# Patient Record
Sex: Male | Born: 1955 | Race: Black or African American | Hispanic: No | Marital: Married | Smoking: Never smoker
Health system: Southern US, Community
[De-identification: ages and names within clinical notes are randomized; demographics above are authoritative.]

## PROBLEM LIST (undated history)

## (undated) DIAGNOSIS — M109 Gout, unspecified: Secondary | ICD-10-CM

## (undated) DIAGNOSIS — I2510 Atherosclerotic heart disease of native coronary artery without angina pectoris: Secondary | ICD-10-CM

## (undated) DIAGNOSIS — I639 Cerebral infarction, unspecified: Secondary | ICD-10-CM

## (undated) DIAGNOSIS — Z5189 Encounter for other specified aftercare: Secondary | ICD-10-CM

## (undated) DIAGNOSIS — E7800 Pure hypercholesterolemia, unspecified: Secondary | ICD-10-CM

## (undated) DIAGNOSIS — I10 Essential (primary) hypertension: Secondary | ICD-10-CM

## (undated) DIAGNOSIS — R569 Unspecified convulsions: Secondary | ICD-10-CM

## (undated) HISTORY — PX: TONSILLECTOMY: SUR1361

## (undated) HISTORY — PX: CORONARY ANGIOPLASTY WITH STENT PLACEMENT: SHX49

---

## 2011-06-22 ENCOUNTER — Encounter: Payer: Self-pay | Admitting: *Deleted

## 2011-06-22 ENCOUNTER — Emergency Department (HOSPITAL_BASED_OUTPATIENT_CLINIC_OR_DEPARTMENT_OTHER)
Admission: EM | Admit: 2011-06-22 | Discharge: 2011-06-22 | Disposition: A | Discharge status: Home / Self Care | Payer: Medicare Other | Attending: Emergency Medicine | Admitting: Emergency Medicine

## 2011-06-22 DIAGNOSIS — I2510 Atherosclerotic heart disease of native coronary artery without angina pectoris: Secondary | ICD-10-CM | POA: Insufficient documentation

## 2011-06-22 DIAGNOSIS — Z79899 Other long term (current) drug therapy: Secondary | ICD-10-CM | POA: Insufficient documentation

## 2011-06-22 DIAGNOSIS — Z8679 Personal history of other diseases of the circulatory system: Secondary | ICD-10-CM | POA: Insufficient documentation

## 2011-06-22 DIAGNOSIS — R569 Unspecified convulsions: Secondary | ICD-10-CM | POA: Insufficient documentation

## 2011-06-22 HISTORY — DX: Cerebral infarction, unspecified: I63.9

## 2011-06-22 HISTORY — DX: Unspecified convulsions: R56.9

## 2011-06-22 HISTORY — DX: Encounter for other specified aftercare: Z51.89

## 2011-06-22 HISTORY — DX: Atherosclerotic heart disease of native coronary artery without angina pectoris: I25.10

## 2011-06-22 LAB — DIFFERENTIAL
Eosinophils Absolute: 0.3 10*3/uL (ref 0.0–0.7)
Eosinophils Relative: 2 % (ref 0–5)
Lymphs Abs: 4.8 10*3/uL — ABNORMAL HIGH (ref 0.7–4.0)
Monocytes Absolute: 0.7 10*3/uL (ref 0.1–1.0)

## 2011-06-22 LAB — BASIC METABOLIC PANEL
CO2: 25 mEq/L (ref 19–32)
Calcium: 9.5 mg/dL (ref 8.4–10.5)
Chloride: 103 mEq/L (ref 96–112)
Creatinine, Ser: 1.2 mg/dL (ref 0.50–1.35)
Glucose, Bld: 130 mg/dL — ABNORMAL HIGH (ref 70–99)
Sodium: 139 mEq/L (ref 135–145)

## 2011-06-22 LAB — URINALYSIS, ROUTINE W REFLEX MICROSCOPIC
Glucose, UA: NEGATIVE mg/dL
Leukocytes, UA: NEGATIVE
Nitrite: NEGATIVE
Specific Gravity, Urine: 1.026 (ref 1.005–1.030)
pH: 5 (ref 5.0–8.0)

## 2011-06-22 LAB — CBC
HCT: 39.3 % (ref 39.0–52.0)
MCH: 29.7 pg (ref 26.0–34.0)
MCV: 85.8 fL (ref 78.0–100.0)
Platelets: 289 10*3/uL (ref 150–400)
RBC: 4.58 MIL/uL (ref 4.22–5.81)

## 2011-06-22 LAB — CARDIAC PANEL(CRET KIN+CKTOT+MB+TROPI)
Relative Index: 0.8 (ref 0.0–2.5)
Total CK: 437 U/L — ABNORMAL HIGH (ref 7–232)

## 2011-06-22 MED ORDER — PHENYTOIN SODIUM EXTENDED 100 MG PO CAPS
300.0000 mg | ORAL_CAPSULE | Freq: Once | ORAL | Status: AC
  Administered 2011-06-22: 300 mg via ORAL
  Filled 2011-06-22: qty 3

## 2011-06-22 MED ORDER — PHENYTOIN SODIUM EXTENDED 100 MG PO CAPS: 300.0000 mg | ORAL_CAPSULE | Freq: Every day | ORAL | Status: DC

## 2011-06-22 MED ORDER — POTASSIUM CHLORIDE CRYS ER 20 MEQ PO TBCR
40.0000 meq | EXTENDED_RELEASE_TABLET | Freq: Once | ORAL | Status: AC
  Administered 2011-06-22: 40 meq via ORAL
  Filled 2011-06-22: qty 2

## 2011-06-22 NOTE — ED Notes (Signed)
Pt was visiting family when he states that he became hot, flushed and dizzy states that he closed his eyes and woke with medical staff attending him. Pt with hx of seizures and CVA states that he has not had a seizure in 4 years and was taken off of his seizure medication. Pt is alert and oriented. Answers questions appropriately although slowly. Pupils equal and reactive

## 2011-06-22 NOTE — ED Notes (Signed)
I placed a call to Dr. Chase Picket office for the MD on call per Dr. Ashby Dawes. The call was returned @ 0552am

## 2011-06-22 NOTE — ED Provider Notes (Signed)
History     CSN: [id number] Arrival date & time: 06/22/2011  2:00 AM   First MD Initiated Contact with Patient 06/22/11 0532      Chief Complaint  Patient presents with  . Altered Mental Status    (Consider location/radiation/quality/duration/timing/severity/associated sxs/prior treatment) HPI While waiting with his wife who was a patient in the emergency department today patient developed sudden loss of consciousness, not witnessed by staff. Wife reports that he had generalized shaking and possible seizure activity lasting 3 or 4 minutes followed by confusion. Symptoms resolved after several minutes without treatment. Patient presently asymptomatic last seizure was 4 years ago. No treatment prior to coming here. Patient taken off of Dilantin by his primary care Dr. 2 years ago. Denies pain anywhere. Symptoms were moderate to severe lasting 3 or 4 minutes followed by several minutes of confusion Past Medical History  Diagnosis Date  . Stroke   . Coronary artery disease   . Seizures   . Blood transfusion     Past Surgical History  Procedure Date  . Coronary angioplasty with stent placement     No family history on file.  History  Substance Use Topics  . Smoking status: Never Smoker   . Smokeless tobacco: Not on file  . Alcohol Use: Yes     occasional      Review of Systems  Constitutional: Negative.   HENT: Negative.   Respiratory: Negative.   Cardiovascular: Negative.   Gastrointestinal: Negative.   Musculoskeletal: Negative.   Skin: Negative.   Neurological: Positive for seizures.  Hematological: Negative.   Psychiatric/Behavioral: Negative.   All other systems reviewed and are negative.    Allergies  Review of patient's allergies indicates no known allergies.  Home Medications   Current Outpatient Rx  Name Route Sig Dispense Refill  . CLONIDINE HCL PO Oral Take by mouth.      Marland Kitchen PLAVIX PO Oral Take by mouth.      Marland Kitchen HYDROCHLOROTHIAZIDE PO Oral Take by  mouth.      Marland Kitchen HYDROCODONE-ACETAMINOPHEN PO Oral Take by mouth.      Marland Kitchen LISINOPRIL PO Oral Take by mouth.      . SERTRALINE HCL PO Oral Take by mouth.      Marland Kitchen SIMVASTATIN PO Oral Take by mouth.        BP 124/84  Pulse 64  Temp(Src) 97.7 F (36.5 C) (Oral)  Resp 13  Ht 6\' 3"  (1.905 m)  Wt 239 lb (108.41 kg)  BMI 29.87 kg/m2  SpO2 98%  Physical Exam  Constitutional: He appears well-developed and well-nourished.  HENT:  Head: Normocephalic and atraumatic.  Eyes: Conjunctivae are normal. Pupils are equal, round, and reactive to light.  Neck: Neck supple. No tracheal deviation present. No thyromegaly present.  Cardiovascular: Normal rate and regular rhythm.   No murmur heard. Pulmonary/Chest: Effort normal and breath sounds normal.  Abdominal: Soft. Bowel sounds are normal. He exhibits no distension. There is no tenderness.  Musculoskeletal: Normal range of motion. He exhibits no edema and no tenderness.  Neurological: He is alert. No cranial nerve deficit. He exhibits normal muscle tone. Coordination normal.       Gait normal, no lightheadedness on standing  Skin: Skin is warm and dry. No rash noted.  Psychiatric: He has a normal mood and affect.    Date: 06/22/2011  Rate: 70  Rhythm: normal sinus rhythm  QRS Axis: normal  Intervals: normal  ST/T Wave abnormalities: nonspecific ST/T changes  Conduction Disutrbances:none  Narrative Interpretation:   Old EKG Reviewed: none available  ED Course  Procedures (including critical care time)  Labs Reviewed  BASIC METABOLIC PANEL - Abnormal; Notable for the following:    Potassium 3.4 (*)    Glucose, Bld 130 (*)    GFR calc non Af Amer 66 (*)    GFR calc Af Amer 76 (*)    All other components within normal limits  CBC - Abnormal; Notable for the following:    WBC 12.0 (*)    All other components within normal limits  DIFFERENTIAL - Abnormal; Notable for the following:    Lymphs Abs 4.8 (*)    All other components within  normal limits  CARDIAC PANEL(CRET KIN+CKTOT+MB+TROPI) - Abnormal; Notable for the following:    Total CK 437 (*)    All other components within normal limits  URINALYSIS, ROUTINE W REFLEX MICROSCOPIC   No results found.   No diagnosis found. Results for orders placed during the hospital encounter of 06/22/11  BASIC METABOLIC PANEL      Component Value Range   Sodium 139  135 - 145 (mEq/L)   Potassium 3.4 (*) 3.5 - 5.1 (mEq/L)   Chloride 103  96 - 112 (mEq/L)   CO2 25  19 - 32 (mEq/L)   Glucose, Bld 130 (*) 70 - 99 (mg/dL)   BUN 16  6 - 23 (mg/dL)   Creatinine, Ser 8.46  0.50 - 1.35 (mg/dL)   Calcium 9.5  8.4 - 96.2 (mg/dL)   GFR calc non Af Amer 66 (*) >90 (mL/min)   GFR calc Af Amer 76 (*) >90 (mL/min)  CBC      Component Value Range   WBC 12.0 (*) 4.0 - 10.5 (K/uL)   RBC 4.58  4.22 - 5.81 (MIL/uL)   Hemoglobin 13.6  13.0 - 17.0 (g/dL)   HCT 95.2  84.1 - 32.4 (%)   MCV 85.8  78.0 - 100.0 (fL)   MCH 29.7  26.0 - 34.0 (pg)   MCHC 34.6  30.0 - 36.0 (g/dL)   RDW 40.1  02.7 - 25.3 (%)   Platelets 289  150 - 400 (K/uL)  DIFFERENTIAL      Component Value Range   Neutrophils Relative 51  43 - 77 (%)   Neutro Abs 6.2  1.7 - 7.7 (K/uL)   Lymphocytes Relative 40  12 - 46 (%)   Lymphs Abs 4.8 (*) 0.7 - 4.0 (K/uL)   Monocytes Relative 6  3 - 12 (%)   Monocytes Absolute 0.7  0.1 - 1.0 (K/uL)   Eosinophils Relative 2  0 - 5 (%)   Eosinophils Absolute 0.3  0.0 - 0.7 (K/uL)   Basophils Relative 0  0 - 1 (%)   Basophils Absolute 0.1  0.0 - 0.1 (K/uL)  CARDIAC PANEL(CRET KIN+CKTOT+MB+TROPI)      Component Value Range   Total CK 437 (*) 7 - 232 (U/L)   CK, MB 3.5  0.3 - 4.0 (ng/mL)   Troponin I <0.30  <0.30 (ng/mL)   Relative Index 0.8  0.0 - 2.5   URINALYSIS, ROUTINE W REFLEX MICROSCOPIC      Component Value Range   Color, Urine YELLOW  YELLOW    APPearance CLEAR  CLEAR    Specific Gravity, Urine 1.026  1.005 - 1.030    pH 5.0  5.0 - 8.0    Glucose, UA NEGATIVE  NEGATIVE  (mg/dL)   Hgb urine dipstick NEGATIVE  NEGATIVE    Bilirubin  Urine NEGATIVE  NEGATIVE    Ketones, ur NEGATIVE  NEGATIVE (mg/dL)   Protein, ur NEGATIVE  NEGATIVE (mg/dL)   Urobilinogen, UA 0.2  0.0 - 1.0 (mg/dL)   Nitrite NEGATIVE  NEGATIVE    Leukocytes, UA NEGATIVE  NEGATIVE    No results found.    MDM  In light of patient's history of seizure disorder I feel that patient most likely suffered seizure tonight given the history Plan prescription Dilantin,. Spoke with Dr. Wallace Cullens. Patient coughs today for followup and to arrange for neurology referral Diagnosis #1 seizure #2 hypokalemia        Doug Sou, MD 06/22/11 [phone]

## 2011-06-22 NOTE — ED Notes (Signed)
Pt is visitor with his wife who is pt in ED- wife reports she felt pt "kick the bed" and she called for help- pt was found sitting up in chair, eyes open, initially unresponsive to sternal rub- blowing respirations- ED staff and EDP Jacubowitz in to assess assist- pt spontaneously came out of event- initially stated that he was "sleeping"- pt then reported that he felt dizzy and hot so he closed his eyes- states still feeling dizzy at present

## 2018-03-20 ENCOUNTER — Encounter (HOSPITAL_BASED_OUTPATIENT_CLINIC_OR_DEPARTMENT_OTHER): Payer: Self-pay | Admitting: Emergency Medicine

## 2018-03-20 ENCOUNTER — Other Ambulatory Visit: Payer: Self-pay

## 2018-03-20 ENCOUNTER — Emergency Department (HOSPITAL_BASED_OUTPATIENT_CLINIC_OR_DEPARTMENT_OTHER)
Admission: EM | Admit: 2018-03-20 | Discharge: 2018-03-20 | Disposition: A | Discharge status: Home / Self Care | Payer: Medicare PPO | Attending: Emergency Medicine | Admitting: Emergency Medicine

## 2018-03-20 DIAGNOSIS — Z7902 Long term (current) use of antithrombotics/antiplatelets: Secondary | ICD-10-CM | POA: Diagnosis not present

## 2018-03-20 DIAGNOSIS — Z79899 Other long term (current) drug therapy: Secondary | ICD-10-CM | POA: Insufficient documentation

## 2018-03-20 DIAGNOSIS — I2510 Atherosclerotic heart disease of native coronary artery without angina pectoris: Secondary | ICD-10-CM | POA: Diagnosis not present

## 2018-03-20 DIAGNOSIS — M109 Gout, unspecified: Secondary | ICD-10-CM | POA: Diagnosis not present

## 2018-03-20 DIAGNOSIS — Z8673 Personal history of transient ischemic attack (TIA), and cerebral infarction without residual deficits: Secondary | ICD-10-CM | POA: Diagnosis not present

## 2018-03-20 DIAGNOSIS — M25579 Pain in unspecified ankle and joints of unspecified foot: Secondary | ICD-10-CM | POA: Diagnosis present

## 2018-03-20 DIAGNOSIS — I10 Essential (primary) hypertension: Secondary | ICD-10-CM | POA: Insufficient documentation

## 2018-03-20 HISTORY — DX: Gout, unspecified: M10.9

## 2018-03-20 HISTORY — DX: Pure hypercholesterolemia, unspecified: E78.00

## 2018-03-20 HISTORY — DX: Essential (primary) hypertension: I10

## 2018-03-20 MED ORDER — COLCHICINE 0.6 MG PO TABS: 0.6000 mg | ORAL_TABLET | Freq: Every day | ORAL | 0 refills | Status: DC

## 2018-03-20 MED ORDER — OXYCODONE-ACETAMINOPHEN 5-325 MG PO TABS: 1.0000 | ORAL_TABLET | Freq: Four times a day (QID) | ORAL | 0 refills | Status: DC | PRN

## 2018-03-20 MED ORDER — COLCHICINE 0.6 MG PO TABS
1.2000 mg | ORAL_TABLET | Freq: Once | ORAL | Status: AC
  Administered 2018-03-20: 1.2 mg via ORAL
  Filled 2018-03-20: qty 2

## 2018-03-20 MED ORDER — PREDNISONE 20 MG PO TABS: 40.0000 mg | ORAL_TABLET | Freq: Every day | ORAL | 0 refills | Status: DC

## 2018-03-20 MED ORDER — PREDNISONE 20 MG PO TABS
40.0000 mg | ORAL_TABLET | Freq: Once | ORAL | Status: AC
  Administered 2018-03-20: 40 mg via ORAL
  Filled 2018-03-20: qty 2

## 2018-03-20 MED ORDER — OXYCODONE-ACETAMINOPHEN 5-325 MG PO TABS
1.0000 | ORAL_TABLET | Freq: Once | ORAL | Status: AC
  Administered 2018-03-20: 1 via ORAL
  Filled 2018-03-20: qty 1

## 2018-03-20 NOTE — ED Triage Notes (Signed)
Pt c/o of bilat feet pain x 1 week. History of gout.

## 2018-03-28 NOTE — ED Provider Notes (Signed)
[facility] EMERGENCY DEPARTMENT Provider Note   CSN: [id number] Arrival date & time: 03/20/18  1329     History   Chief Complaint Chief Complaint  Patient presents with  . Foot Pain    HPI Cameron Woodward is a 62 y.o. male.  HPI   62 year old male with bilateral foot pain.  He has a past history of gout.  Feels like current symptoms are similar to that.  Pain is in the distal foot near the first T joints bilaterally.  Pain at rest much worse to touch or walking/movement.  No fevers or chills.  Past Medical History:  Diagnosis Date  . Blood transfusion   . Coronary artery disease   . Gout   . High cholesterol   . Hypertension   . Seizures (HCC)   . Stroke [facility])     There are no active problems to display for this patient.   Past Surgical History:  Procedure Laterality Date  . CORONARY ANGIOPLASTY WITH STENT PLACEMENT    . TONSILLECTOMY          Home Medications    Prior to Admission medications   Medication Sig Start Date End Date Taking? Authorizing Provider  amLODipine (NORVASC) 10 MG tablet Take 10 mg by mouth daily.   Yes [provider]  CLONIDINE HCL PO Take by mouth.      [provider]  Clopidogrel Bisulfate (PLAVIX PO) Take by mouth.      [provider]  colchicine 0.6 MG tablet Take 1 tablet (0.6 mg total) by mouth daily. 03/20/18   Raeford Razor, MD  HYDROCHLOROTHIAZIDE PO Take by mouth.      [provider]  HYDROCODONE-ACETAMINOPHEN PO Take by mouth.      [provider]  LISINOPRIL PO Take by mouth.      [provider]  oxyCODONE-acetaminophen (PERCOCET/ROXICET) 5-325 MG tablet Take 1 tablet by mouth every 6 (six) hours as needed for severe pain. 03/20/18   Raeford Razor, MD  phenytoin (DILANTIN) 100 MG ER capsule Take 3 capsules (300 mg total) by mouth daily. 06/22/11 06/21/12  Doug Sou, MD  predniSONE (DELTASONE) 20 MG tablet Take 2 tablets (40 mg total) by mouth daily.  03/20/18   Raeford Razor, MD  SERTRALINE HCL PO Take by mouth.      [provider]  SIMVASTATIN PO Take by mouth.      [provider]    Family History No family history on file.  Social History Social History   Tobacco Use  . Smoking status: Never Smoker  . Smokeless tobacco: Never Used  Substance Use Topics  . Alcohol use: Yes    Comment: occasional  . Drug use: No     Allergies   Patient has no known allergies.   Review of Systems Review of Systems  All systems reviewed and negative, other than as noted in HPI.  Physical Exam Updated Vital Signs BP (!) 113/97 (BP Location: Right Arm)   Pulse (!) 106   Temp 98.9 F (37.2 C) (Oral)   Resp 20   Ht 6\' 2"  (1.88 m)   Wt 106.6 kg   SpO2 98%   BMI 30.17 kg/m   Physical Exam  Constitutional: He appears well-developed and well-nourished. No distress.  HENT:  Head: Normocephalic and atraumatic.  Eyes: Conjunctivae are normal. Right eye exhibits no discharge. Left eye exhibits no discharge.  Neck: Neck supple.  Cardiovascular: Normal rate, regular rhythm and normal heart sounds. Exam  reveals no gallop and no friction rub.  No murmur heard. Pulmonary/Chest: Effort normal and breath sounds normal. No respiratory distress.  Abdominal: Soft. He exhibits no distension. There is no tenderness.  Musculoskeletal: He exhibits tenderness. He exhibits no edema.  Faint erythema and tenderness with palpation and movement of the first MTP joint bilaterally.  Minimal swelling.  Neurological: He is alert.  Skin: Skin is warm and dry.  Psychiatric: He has a normal mood and affect. His behavior is normal. Thought content normal.  Nursing note and vitals reviewed.    ED Treatments / Results  Labs (all labs ordered are listed, but only abnormal results are displayed) Labs Reviewed - No data to display  EKG None  Radiology No results found.  Procedures Procedures (including critical care  time)  Medications Ordered in ED Medications  oxyCODONE-acetaminophen (PERCOCET/ROXICET) 5-325 MG per tablet 1 tablet (1 tablet Oral Given 03/20/18 1404)  colchicine tablet 1.2 mg (1.2 mg Oral Given 03/20/18 1404)  predniSONE (DELTASONE) tablet 40 mg (40 mg Oral Given 03/20/18 1404)     Initial Impression / Assessment and Plan / ED Course  I have reviewed the triage vital signs and the nursing notes.  Pertinent labs & imaging results that were available during my care of the patient were reviewed by me and considered in my medical decision making (see chart for details).     Symptoms consistent with gout.  Doubt infectious.  Plan symptom medic treatment.  Return precautions discussed.  Final Clinical Impressions(s) / ED Diagnoses   Final diagnoses:  Gout of multiple sites, unspecified cause, unspecified chronicity    ED Discharge Orders         Ordered    colchicine 0.6 MG tablet  Daily     03/20/18 1400    predniSONE (DELTASONE) 20 MG tablet  Daily,   Status:  Discontinued     03/20/18 1400    oxyCODONE-acetaminophen (PERCOCET/ROXICET) 5-325 MG tablet  Every 6 hours PRN     03/20/18 1400    predniSONE (DELTASONE) 20 MG tablet  Daily     03/20/18 1401           Raeford Razor, MD 03/28/18 [phone]

## 2020-03-06 ENCOUNTER — Emergency Department (HOSPITAL_COMMUNITY): Payer: Medicare HMO

## 2020-03-06 ENCOUNTER — Inpatient Hospital Stay (HOSPITAL_COMMUNITY)
Admission: EM | Admit: 2020-03-06 | Discharge: 2020-03-10 | DRG: 177 | Disposition: A | Discharge status: Home / Self Care | Payer: Medicare HMO | Attending: Internal Medicine | Admitting: Internal Medicine

## 2020-03-06 ENCOUNTER — Encounter (HOSPITAL_COMMUNITY): Payer: Self-pay

## 2020-03-06 DIAGNOSIS — I2510 Atherosclerotic heart disease of native coronary artery without angina pectoris: Secondary | ICD-10-CM | POA: Diagnosis present

## 2020-03-06 DIAGNOSIS — G40909 Epilepsy, unspecified, not intractable, without status epilepticus: Secondary | ICD-10-CM | POA: Diagnosis present

## 2020-03-06 DIAGNOSIS — E861 Hypovolemia: Secondary | ICD-10-CM | POA: Diagnosis present

## 2020-03-06 DIAGNOSIS — Z6830 Body mass index (BMI) 30.0-30.9, adult: Secondary | ICD-10-CM

## 2020-03-06 DIAGNOSIS — E860 Dehydration: Secondary | ICD-10-CM | POA: Diagnosis present

## 2020-03-06 DIAGNOSIS — D649 Anemia, unspecified: Secondary | ICD-10-CM | POA: Diagnosis present

## 2020-03-06 DIAGNOSIS — E669 Obesity, unspecified: Secondary | ICD-10-CM | POA: Diagnosis present

## 2020-03-06 DIAGNOSIS — N179 Acute kidney failure, unspecified: Secondary | ICD-10-CM | POA: Diagnosis present

## 2020-03-06 DIAGNOSIS — Z7952 Long term (current) use of systemic steroids: Secondary | ICD-10-CM | POA: Diagnosis not present

## 2020-03-06 DIAGNOSIS — Z79899 Other long term (current) drug therapy: Secondary | ICD-10-CM

## 2020-03-06 DIAGNOSIS — Z955 Presence of coronary angioplasty implant and graft: Secondary | ICD-10-CM

## 2020-03-06 DIAGNOSIS — U071 COVID-19: Secondary | ICD-10-CM | POA: Diagnosis present

## 2020-03-06 DIAGNOSIS — I10 Essential (primary) hypertension: Secondary | ICD-10-CM | POA: Diagnosis present

## 2020-03-06 DIAGNOSIS — Z8673 Personal history of transient ischemic attack (TIA), and cerebral infarction without residual deficits: Secondary | ICD-10-CM | POA: Diagnosis not present

## 2020-03-06 DIAGNOSIS — E785 Hyperlipidemia, unspecified: Secondary | ICD-10-CM | POA: Diagnosis present

## 2020-03-06 DIAGNOSIS — R7989 Other specified abnormal findings of blood chemistry: Secondary | ICD-10-CM

## 2020-03-06 DIAGNOSIS — E7800 Pure hypercholesterolemia, unspecified: Secondary | ICD-10-CM | POA: Diagnosis present

## 2020-03-06 DIAGNOSIS — Z7902 Long term (current) use of antithrombotics/antiplatelets: Secondary | ICD-10-CM | POA: Diagnosis not present

## 2020-03-06 DIAGNOSIS — J1282 Pneumonia due to coronavirus disease 2019: Secondary | ICD-10-CM | POA: Diagnosis present

## 2020-03-06 DIAGNOSIS — I69354 Hemiplegia and hemiparesis following cerebral infarction affecting left non-dominant side: Secondary | ICD-10-CM

## 2020-03-06 DIAGNOSIS — M109 Gout, unspecified: Secondary | ICD-10-CM | POA: Diagnosis present

## 2020-03-06 LAB — COMPREHENSIVE METABOLIC PANEL
ALT: 35 U/L (ref 0–44)
AST: 39 U/L (ref 15–41)
Albumin: 4.3 g/dL (ref 3.5–5.0)
Alkaline Phosphatase: 84 U/L (ref 38–126)
Anion gap: 15 (ref 5–15)
BUN: 54 mg/dL — ABNORMAL HIGH (ref 8–23)
CO2: 20 mmol/L — ABNORMAL LOW (ref 22–32)
Calcium: 9 mg/dL (ref 8.9–10.3)
Chloride: 104 mmol/L (ref 98–111)
Creatinine, Ser: 3.15 mg/dL — ABNORMAL HIGH (ref 0.61–1.24)
GFR calc Af Amer: 23 mL/min — ABNORMAL LOW (ref >60)
GFR calc non Af Amer: 20 mL/min — ABNORMAL LOW (ref >60)
Glucose, Bld: 114 mg/dL — ABNORMAL HIGH (ref 70–99)
Potassium: 3.9 mmol/L (ref 3.5–5.1)
Sodium: 139 mmol/L (ref 135–145)
Total Bilirubin: 0.6 mg/dL (ref 0.3–1.2)
Total Protein: 8.3 g/dL — ABNORMAL HIGH (ref 6.5–8.1)

## 2020-03-06 LAB — CBC WITH DIFFERENTIAL/PLATELET
Abs Immature Granulocytes: 0.06 10*3/uL (ref 0.00–0.07)
Basophils Absolute: 0 10*3/uL (ref 0.0–0.1)
Basophils Relative: 0 %
Eosinophils Absolute: 0 10*3/uL (ref 0.0–0.5)
Eosinophils Relative: 0 %
HCT: 40.3 % (ref 39.0–52.0)
Hemoglobin: 13.4 g/dL (ref 13.0–17.0)
Immature Granulocytes: 1 %
Lymphocytes Relative: 13 %
Lymphs Abs: 1.3 10*3/uL (ref 0.7–4.0)
MCH: 30.2 pg (ref 26.0–34.0)
MCHC: 33.3 g/dL (ref 30.0–36.0)
MCV: 90.8 fL (ref 80.0–100.0)
Monocytes Absolute: 0.5 10*3/uL (ref 0.1–1.0)
Monocytes Relative: 5 %
Neutro Abs: 8.3 10*3/uL — ABNORMAL HIGH (ref 1.7–7.7)
Neutrophils Relative %: 81 %
Platelets: 233 10*3/uL (ref 150–400)
RBC: 4.44 MIL/uL (ref 4.22–5.81)
RDW: 13.2 % (ref 11.5–15.5)
WBC: 10.2 10*3/uL (ref 4.0–10.5)
nRBC: 0 % (ref 0.0–0.2)

## 2020-03-06 LAB — FIBRINOGEN: Fibrinogen: 674 mg/dL — ABNORMAL HIGH (ref 210–475)

## 2020-03-06 LAB — PROCALCITONIN: Procalcitonin: 0.81 ng/mL

## 2020-03-06 LAB — LACTATE DEHYDROGENASE: LDH: 254 U/L — ABNORMAL HIGH (ref 98–192)

## 2020-03-06 LAB — SODIUM, URINE, RANDOM: Sodium, Ur: 27 mmol/L

## 2020-03-06 LAB — URINALYSIS, ROUTINE W REFLEX MICROSCOPIC
Bilirubin Urine: NEGATIVE
Glucose, UA: NEGATIVE mg/dL
Ketones, ur: NEGATIVE mg/dL
Leukocytes,Ua: NEGATIVE
Nitrite: NEGATIVE
Protein, ur: 30 mg/dL — AB
Specific Gravity, Urine: 1.019 (ref 1.005–1.030)
pH: 5 (ref 5.0–8.0)

## 2020-03-06 LAB — PHENYTOIN LEVEL, TOTAL: Phenytoin Lvl: <2.5 ug/mL — ABNORMAL LOW (ref 10.0–20.0)

## 2020-03-06 LAB — C-REACTIVE PROTEIN: CRP: 15.6 mg/dL — ABNORMAL HIGH (ref <1.0)

## 2020-03-06 LAB — TRIGLYCERIDES: Triglycerides: 220 mg/dL — ABNORMAL HIGH (ref <150)

## 2020-03-06 LAB — LACTIC ACID, PLASMA
Lactic Acid, Venous: 1.8 mmol/L (ref 0.5–1.9)
Lactic Acid, Venous: 1.7 mmol/L (ref 0.5–1.9)

## 2020-03-06 LAB — CREATININE, URINE, RANDOM: Creatinine, Urine: 485.32 mg/dL

## 2020-03-06 LAB — SARS CORONAVIRUS 2 BY RT PCR (HOSPITAL ORDER, PERFORMED IN ~~LOC~~ HOSPITAL LAB): SARS Coronavirus 2: POSITIVE — AB

## 2020-03-06 LAB — TROPONIN I (HIGH SENSITIVITY): Troponin I (High Sensitivity): 15 ng/L (ref <18)

## 2020-03-06 LAB — FERRITIN: Ferritin: 681 ng/mL — ABNORMAL HIGH (ref 24–336)

## 2020-03-06 LAB — D-DIMER, QUANTITATIVE: D-Dimer, Quant: 2.4 ug/mL-FEU — ABNORMAL HIGH (ref 0.00–0.50)

## 2020-03-06 MED ORDER — SODIUM CHLORIDE 0.9 % IV SOLN
200.0000 mg | Freq: Once | INTRAVENOUS | Status: AC
  Administered 2020-03-06: 200 mg via INTRAVENOUS
  Filled 2020-03-06: qty 200

## 2020-03-06 MED ORDER — SODIUM CHLORIDE 0.9 % IV SOLN
100.0000 mg | Freq: Every day | INTRAVENOUS | Status: AC
  Administered 2020-03-07 – 2020-03-10 (×4): 100 mg via INTRAVENOUS
  Filled 2020-03-06 (×5): qty 20

## 2020-03-06 MED ORDER — SODIUM CHLORIDE 0.9 % IV SOLN: Freq: Once | INTRAVENOUS | Status: AC

## 2020-03-06 MED ORDER — ACETAMINOPHEN 325 MG PO TABS
650.0000 mg | ORAL_TABLET | Freq: Once | ORAL | Status: AC
  Administered 2020-03-06: 650 mg via ORAL
  Filled 2020-03-06: qty 2

## 2020-03-06 MED ORDER — SODIUM CHLORIDE 0.9 % IV BOLUS
500.0000 mL | Freq: Once | INTRAVENOUS | Status: AC
  Administered 2020-03-06: 500 mL via INTRAVENOUS

## 2020-03-06 NOTE — H&P (Signed)
Cameron Woodward HER:[id number] DOB: 02/08/1956 DOA: 03/06/2020     PCP: Patient, No Pcp Per   Outpatient Specialists: none   Patient arrived to ER on 03/06/20 at 1313 Referred by Attending Alvira Monday, MD   Patient coming from: home Lives   With family    Chief Complaint:   Chief Complaint  Patient presents with  . COVID Like Symptoms    HPI: Cameron Woodward is a 64 y.o. male with medical history significant of CVA with right -sided weakness, CAD, HTN, gout history of seizures    Presented with fever weakness cough body aches fatigue no shortness of breath symptoms started on Monday after patient traveled to [place].  Reports decreased appetite chills he did not check his temperature.  No chest pain one episode of diarrhea no melena no bright red blood per rectum. On EMS arrival heart rate up to 130s satting 97% room air   Infectious risk factors:  Reports  Fever  dry cough,  URI symptoms, Diarrhea ,  Body aches, severe fatigue     Has NOt been vaccinated against COVID    Initial COVID TEST    POSITIVE,   Lab Results  Component Value Date   SARSCOV2NAA POSITIVE (A) 03/06/2020    Regarding pertinent Chronic problems:     Hyperlipidemia - on statins simvastatin   Lipid Panel     Component Value Date/Time   TRIG 220 (H) 03/06/2020 1754     HTN on Norvasc, clonidine hydrochlorothiazide lisinopril      CAD  - On Aspirin, statin,   Plavix                   Hx of CVA -  With  residual deficits on Aspirin 81 mg, 325, Plavix   Hx of Seizure on Dilantin, last seizure 2012  While in ER: Noted to be in AKI with creatinine up to 3.15 from baseline of 1.5. Covid positive Inflammatory markers elevated CRP 15.6   Hospitalist was called for admission for Covid infection  The following Work up has been ordered so far:  Orders Placed This Encounter  Procedures  . SARS Coronavirus 2 by RT PCR (hospital order, performed in [facility] hospital lab)  Nasopharyngeal Nasopharyngeal Swab  . Blood Culture (routine x 2)  . DG Chest 2 View  . CT Chest Wo Contrast  . Lactic acid, plasma  . CBC WITH DIFFERENTIAL  . Comprehensive metabolic panel  . D-dimer, quantitative  . Procalcitonin  . Lactate dehydrogenase  . Ferritin  . Triglycerides  . Fibrinogen  . C-reactive protein  . Urinalysis, Routine w reflex microscopic  . Diet NPO time specified  . Cardiac monitoring  . Insert peripheral IV x 2  . Initiate Carrier Fluid Protocol  . Place surgical mask on patient  . Patient to wear surgical mask during transportation  . Assess patient for ability to self-prone. If able (can move self in bed, ambulate) and stable (SpO2 and oxygen requirement):  . RN/NT - Document specific oxygen requirements in [facility]  . Notify EDP if new oxygen requirements escalates > 4L per minute [place]  . RN to draw the following extra tubes:  . Bladder scan  . Consult to hospitalist  ALL PATIENTS BEING ADMITTED/HAVING PROCEDURES NEED COVID-19 SCREENING  . Airborne and Contact precautions  . Pulse oximetry, continuous  . ED EKG 12-Lead     Following Medications were ordered in ER: Medications  acetaminophen (TYLENOL) tablet 650 mg (has no administration  in time range)  0.9 %  sodium chloride infusion (has no administration in time range)  sodium chloride 0.9 % bolus 500 mL (500 mLs Intravenous New Bag/Given (Non-Interop) 03/06/20 1757)        Consult Orders  (From admission, onward)         Start     Ordered   03/06/20 2019  Consult to hospitalist  ALL PATIENTS BEING ADMITTED/HAVING PROCEDURES NEED COVID-19 SCREENING  Once       Comments: ALL PATIENTS BEING ADMITTED/HAVING PROCEDURES NEED COVID-19 SCREENING  Provider:  (Not yet assigned)  Question Answer Comment  Place call to: [facility]   Reason for Consult Admit      03/06/20 2018           Significant initial  Findings: Abnormal Labs Reviewed  SARS CORONAVIRUS 2 BY RT PCR (HOSPITAL ORDER,  PERFORMED IN [place] [facility] LAB) - Abnormal; Notable for the following components:      Result Value   SARS Coronavirus 2 POSITIVE (*)    All other components within normal limits  CBC WITH DIFFERENTIAL/PLATELET - Abnormal; Notable for the following components:   Neutro Abs 8.3 (*)    All other components within normal limits  COMPREHENSIVE METABOLIC PANEL - Abnormal; Notable for the following components:   CO2 20 (*)    Glucose, Bld 114 (*)    BUN 54 (*)    Creatinine, Ser 3.15 (*)    Total Protein 8.3 (*)    GFR calc non Af Amer 20 (*)    GFR calc Af Amer 23 (*)    All other components within normal limits  D-DIMER, QUANTITATIVE (NOT AT [facility]) - Abnormal; Notable for the following components:   D-Dimer, Quant 2.40 (*)    All other components within normal limits  LACTATE DEHYDROGENASE - Abnormal; Notable for the following components:   LDH 254 (*)    All other components within normal limits  FERRITIN - Abnormal; Notable for the following components:   Ferritin 681 (*)    All other components within normal limits  TRIGLYCERIDES - Abnormal; Notable for the following components:   Triglycerides 220 (*)    All other components within normal limits  FIBRINOGEN - Abnormal; Notable for the following components:   Fibrinogen 674 (*)    All other components within normal limits  C-REACTIVE PROTEIN - Abnormal; Notable for the following components:   CRP 15.6 (*)    All other components within normal limits     Otherwise labs showing:    Recent Labs  Lab 03/06/20 1754  NA 139  K 3.9  CO2 20*  GLUCOSE 114*  BUN 54*  CREATININE 3.15*  CALCIUM 9.0    Cr   Up from baseline see below Lab Results  Component Value Date   CREATININE 3.15 (H) 03/06/2020   CREATININE 1.20 06/22/2011    Recent Labs  Lab 03/06/20 1754  AST 39  ALT 35  ALKPHOS 84  BILITOT 0.6  PROT 8.3*  ALBUMIN 4.3   Lab Results  Component Value Date   CALCIUM 9.0 03/06/2020      WBC       Component Value Date/Time   WBC 10.2 03/06/2020 1754   ANC    Component Value Date/Time   NEUTROABS 8.3 (H) 03/06/2020 1754   ALC No components found for: LYMPHAB    Plt: Lab Results  Component Value Date   PLT 233 03/06/2020    Lactic Acid, Venous  Component Value Date/Time   LATICACIDVEN 1.8 03/06/2020 1754      Procalcitonin  0.18   COVID-19 Labs  Recent Labs    03/06/20 1754  DDIMER 2.40*  FERRITIN 681*  LDH 254*  CRP 15.6*    Lab Results  Component Value Date   SARSCOV2NAA POSITIVE (A) 03/06/2020    HG/HCT stable,      Component Value Date/Time   HGB 13.4 03/06/2020 1754   HCT 40.3 03/06/2020 1754    No results for input(s): LIPASE, AMYLASE in the last 168 hours. No results for input(s): AMMONIA in the last 168 hours.     Troponin 15 Cardiac Panel (last 3 results) No results for input(s): CKTOTAL, CKMB, TROPONINI, RELINDX in the last 72 hours.   ECG: Ordered Personally reviewed by me showing: HR : 106 Rhythm: Sinus tachycardia   no evidence of ischemic changes QTC 420     Ordered    CXR -possible consolidation   CT  chest -  evidence of bilateral infiltrate      ED Triage Vitals  Enc Vitals Group     BP 03/06/20 1328 126/75     Pulse Rate 03/06/20 1328 (!) 124     Resp 03/06/20 1328 20     Temp 03/06/20 1328 (!) 102.1 F (38.9 C)     Temp Source 03/06/20 1328 Oral     SpO2 03/06/20 1328 95 %     Weight --      Height --      Head Circumference --      Peak Flow --      Pain Score 03/06/20 1322 0     Pain Loc --      Pain Edu? --      Excl. in GC? --   TMAX(24)@       Latest  Blood pressure 126/82, pulse 99, temperature 100.3 F (37.9 C), temperature source Oral, resp. rate 10, SpO2 98 %.       Review of Systems:    Pertinent positives include: Fevers, chills, fatigue , diarrhea,  Constitutional:  No weight loss, night sweats,, weight loss  HEENT:  No headaches, Difficulty swallowing,Tooth/dental problems,Sore  throat,  No sneezing, itching, ear ache, nasal congestion, post nasal drip,  Cardio-vascular:  No chest pain, Orthopnea, PND, anasarca, dizziness, palpitations.no Bilateral lower extremity swelling  GI:  No heartburn, indigestion, abdominal pain, nausea, vomiting change in bowel habits, loss of appetite, melena, blood in stool, hematemesis Resp:  no shortness of breath at rest. No dyspnea on exertion, No excess mucus, no productive cough, No non-productive cough, No coughing up of blood.No change in color of mucus.No wheezing. Skin:  no rash or lesions. No jaundice GU:  no dysuria, change in color of urine, no urgency or frequency. No straining to urinate.  No flank pain.  Musculoskeletal:  No joint pain or no joint swelling. No decreased range of motion. No back pain.  Psych:  No change in mood or affect. No depression or anxiety. No memory loss.  Neuro: no localizing neurological complaints, no tingling, no weakness, no double vision, no gait abnormality, no slurred speech, no confusion  All systems reviewed and apart from HOPI all are negative  Past Medical History:   Past Medical History:  Diagnosis Date  . Blood transfusion   . Coronary artery disease   . Gout   . High cholesterol   . Hypertension   . Seizures (HCC)   . Stroke [facility])  Past Surgical History:  Procedure Laterality Date  . CORONARY ANGIOPLASTY WITH STENT PLACEMENT    . TONSILLECTOMY      Social History:  Ambulatory   independently       reports that he has never smoked. He has never used smokeless tobacco. He reports current alcohol use. He reports that he does not use drugs.     Family History:   Family History  Problem Relation Age of Onset  . CAD Father     Allergies: No Known Allergies   Prior to Admission medications   Medication Sig Start Date End Date Taking? Authorizing Provider  allopurinol (ZYLOPRIM) 100 MG tablet Take 200 mg by mouth daily. 01/03/20   [provider]  amLODipine (NORVASC) 10 MG tablet Take 10 mg by mouth daily.    [provider]  atorvastatin (LIPITOR) 40 MG tablet  02/14/20   [provider]  cloNIDine (CATAPRES) 0.1 MG tablet  12/25/19   [provider]  CLONIDINE HCL PO Take by mouth.      [provider]  clopidogrel (PLAVIX) 75 MG tablet Take 75 mg by mouth daily. 02/14/20   [provider]  Clopidogrel Bisulfate (PLAVIX PO) Take by mouth.      [provider]  colchicine 0.6 MG tablet Take 1 tablet (0.6 mg total) by mouth daily. 03/20/18   Raeford Razor, MD  HYDROCHLOROTHIAZIDE PO Take by mouth.      [provider]  HYDROCODONE-ACETAMINOPHEN PO Take by mouth.      [provider]  LISINOPRIL PO Take by mouth.      [provider]  lisinopril-hydrochlorothiazide (ZESTORETIC) 20-12.5 MG tablet Take 1 tablet by mouth 2 (two) times daily. 02/14/20   [provider]  oxyCODONE-acetaminophen (PERCOCET/ROXICET) 5-325 MG tablet Take 1 tablet by mouth every 6 (six) hours as needed for severe pain. 03/20/18   Raeford Razor, MD  phenytoin (DILANTIN) 100 MG ER capsule Take 3 capsules (300 mg total) by mouth daily. 06/22/11 06/21/12  Doug Sou, MD  predniSONE (DELTASONE) 20 MG tablet Take 2 tablets (40 mg total) by mouth daily. 03/20/18   Raeford Razor, MD  SERTRALINE HCL PO Take by mouth.      [provider]  SIMVASTATIN PO Take by mouth.      [provider]   Physical Exam: Vitals with BMI 03/06/2020 03/06/2020 03/06/2020  Height - - -  Weight - - -  BMI - - -  Systolic 126 119 409  Diastolic 82 85 75  Pulse 99 106 124     1. General:  in No Acute distress   Chronically ill  appearing 2. Psychological: Alert and Oriented 3. Head/ENT:    Dry Mucous Membranes                          Head Non traumatic, neck supple                           Poor Dentition 4. SKIN decreased Skin turgor,  Skin clean Dry and intact no rash 5.  Heart: Regular rate and rhythm no  Murmur, no Rub or gallop 6. Lungs: Clear to auscultation bilaterally, no wheezes or crackles   7. Abdomen: Soft,  non-tender, Non distended  bowel sounds present 8. Lower extremities: no clubbing, cyanosis, no  edema 9. Neurologically Grossly intact, moving all 4 extremities equally   10. MSK:  Normal range of motion   All other LABS:     Recent Labs  Lab 03/06/20 1754  WBC 10.2  NEUTROABS 8.3*  HGB 13.4  HCT 40.3  MCV 90.8  PLT 233     Recent Labs  Lab 03/06/20 1754  NA 139  K 3.9  CL 104  CO2 20*  GLUCOSE 114*  BUN 54*  CREATININE 3.15*  CALCIUM 9.0     Recent Labs  Lab 03/06/20 1754  AST 39  ALT 35  ALKPHOS 84  BILITOT 0.6  PROT 8.3*  ALBUMIN 4.3       Cultures: No results found for: SDES, SPECREQUEST, CULT, REPTSTATUS   Radiological Exams on Admission: DG Chest 2 View  Result Date: 03/06/2020 CLINICAL DATA:  Feeling sick and weak since Monday. EXAM: CHEST - 2 VIEW COMPARISON:  10/03/2015 FINDINGS: The cardiac silhouette, mediastinal and hilar contours are within normal limits. There appears to be some type of atrial closure device. Somewhat rounded density in the right mid lung is not well seen on the lateral film. It is possible this could be an infiltrate or a pulmonary lesion. Chest CT with contrast is suggested for further evaluation. No pleural effusions. The bony thorax is intact. IMPRESSION: Rounded density in the right mid lung. Chest CT with contrast is suggested for further evaluation. Electronically Signed   By: Rudie MeyerP.  Gallerani M.D.   On: 03/06/2020 14:48   CT Chest Wo Contrast  Result Date: 03/06/2020 CLINICAL DATA:  Cough and fever.  COVID positive.  Weakness. EXAM: CT CHEST WITHOUT CONTRAST TECHNIQUE: Multidetector CT imaging of the chest was performed following the standard protocol without IV contrast. COMPARISON:  Radiograph earlier this day. FINDINGS: Cardiovascular: The thoracic aorta is normal in caliber.  Presumed atrial septal closure device. The heart is normal in size. There is no pericardial effusion. Trace coronary artery calcifications. Mediastinum/Nodes: No enlarged mediastinal lymph nodes. No evidence of hilar adenopathy allowing for lack of IV contrast. No esophageal wall thickening. No visualized thyroid nodule. Lungs/Pleura: Multifocal bilateral geographic ground-glass opacities throughout both lungs, more prominent on the right. There is a slight superimposed consolidative component in the upper lobe. One of these opacities may have been the etiology of the rounded density on radiograph, there is no evidence of solid pulmonary nodule or mass. No pleural fluid. No pneumothorax. Upper Abdomen: Decreased hepatic density consistent with steatosis. Small cyst in the left kidney is partially included. Musculoskeletal: Multilevel degenerative change throughout the spine. There are no acute or suspicious osseous abnormalities. IMPRESSION: 1. Multifocal bilateral geographic ground-glass opacities throughout both lungs, more prominent on the right. There is a slight superimposed consolidative component in the upper lobe. Findings most consistent with multifocal pneumonia, pattern typical of COVID-19. 2. No suspicious pulmonary nodule or mass corresponding to that seen on radiograph. Radiographic appearance may have been related to underlying ground-glass opacity mentioned above. 3. Incidental note of hepatic steatosis in the upper abdomen. Aortic Atherosclerosis (ICD10-I70.0). Electronically Signed   By: Narda RutherfordMelanie  Sanford M.D.   On: 03/06/2020 19:49    Chart has been reviewed    Assessment/Plan  64 y.o. male with medical history significant of CVA with left-sided weakness, CAD, HTN, gout history of seizures     Admitted for  COVID Pneumonia  Present on Admission: . Pneumonia due to COVID-19 virus -    ER Novel Corona Virus testing:  Ordered 03/06/20 and is  positive  Immunization status: Not  vaccinated for Covid     Following concerning  LAB/ imaging findings:      ANC/ALC ratio>3.5 BMP: increased BUN/Cr     CRP, LDH: increased   IL-6 and Ferritin increased   Procalcitonin:0.81 CXR: hazy bilateral peripheral opacities   -Following work-up initiated:      sputum cultures  Ordered 03/06/20, Blood cultures  Ordered 03/06/20,     Following complications noted:    evidence of AKI - will provide gentle rehydration     Diarrhea , decreased PO intake resulting in dehydration - will rehydrate   Plan of treatment: Admit on Airborn Precautions  -given severity of illness initiate steroids Decadron 6mg  q 24 hours And pharmacy consult for remdesivir   - Will follow daily d.dimer - Assess for ability to prone  - Supportive management -Fluid sparing resuscitation  -Provide oxygen as needed currently on  RA  SpO2: 99 % - IF d.dimer elvated >5 will increase dose of lovenox   - Consult PCCM if becomes respiratory unstable   Poor Prognostic factors  64 y.o.  Personal hx of   CAD,  HTN, NON-Vaccinated status Evidence of  organ damage  Present , AKI,  tachycardia present on admission    Will order Airborne and Contact precautions  Family/ patient prognosis discussion: I have discussed case with the family/ patient  who are aware of their prognosis At this point they would like   to be full code     The treatment plan and use of medications and known side effects were discussed with patient  It was clearly explained that there is no proven definitive treatment for COVID-19 infection yet. Any medications used here are based on case reports/anecdotal data which are not peer-reviewed and has not been studied using randomized control trials.  Complete risks and long-term side effects are unknown, however in the best clinical judgment they seem to be of some clinical benefit rather than medical risks.  Patient  agree with the treatment plan and want to receive these treatments as  indicated.     76 CAD (coronary artery disease) - - chronic, continue  statinand beta blocker continue Plavix  . AKI (acute kidney injury) (HCC) - due to COVID and dehydration obtain urine electrolytes  . Dehydration  -we will rehydrate and follow renal status   . Essential hypertension -hold lisinopril given AKI resume home medications when able to tolerate at this point blood pressures somewhat on the softer side   . Hyperlipidemia -chronic stable continue home medications  History of seizure disorder Check Dilantin level and continue adjust as needed Patient may have not been able to tolerate dilantin due to GI symptoms will need to recheck once able to take consistently and adjust if still low Other plan as per orders.  DVT prophylaxis: Lovenox       Code Status:    Code Status: Not on file FULL CODE  as per patient   I had personally discussed CODE STATUS with patient     Family Communication:   Family not at  Bedside    Disposition Plan:     To home once workup is complete and patient is stable   Following barriers for discharge:                            AKI corrected  Will need to be able to tolerate PO                         Consults called: none     Admission status:  ED Disposition    ED Disposition Condition Comment   Admit  [facility]ospital [facility] [[id number]]  Level of Care: Telemetry [5]  Admit to tele based on following criteria: Other see comments  Comments: covid  May admit patient to [facility] or [facility] if equivalent level of care is available:: No  Covid Evaluation: Confirmed COVID Positive  Diagnosis: Pneumonia due to COVID-19 virus [[id number]]  Admitting Physician: Therisa Doyne [[id number]]  Attending Physician: Therisa Doyne [[id number]]  Estimated length of stay: past midnight tomorrow  Certification:: I certify this patient will need inpatient services for at least 2 midnights         inpatient     I Expect 2 midnight stay secondary to severity of patient's current illness need for inpatient interventions justified by the following:  hemodynamic instability despite optimal treatment (tachycardia )  Severe lab/radiological/exam abnormalities including:    bilateral PNA, AKI  and extensive comorbidities including:  CAD   history of stroke with residual deficits   That are currently affecting medical management.   I expect  patient to be hospitalized for 2 midnights requiring inpatient medical care.  Patient is at high risk for adverse outcome (such as loss of life or disability) if not treated.  Indication for inpatient stay as follows:    Hemodynamic instability despite maximal medical therapy,    inability to maintain oral hydration    Need for  IV fluids,     Level of care    tele  indefinitely please discontinue once patient no longer qualifies COVID-19 Labs    Lab Results  Component Value Date   SARSCOV2NAA POSITIVE (A) 03/06/2020     Precautions: admitted as covid positive Airborne and Contact precautions    PPE: Used by the provider:    P100  eye Goggles,  Gloves  gown   Annalisa Colonna 03/07/2020, 12:23 AM    [facility]     after 2 AM please page floor coverage PA If 7AM-7PM, please contact the day team taking care of the patient using [email or web address]   Patient was evaluated in the context of the global COVID-19 pandemic, which necessitated consideration that the patient might be at risk for infection with the SARS-CoV-2 virus that causes COVID-19. Institutional protocols and algorithms that pertain to the evaluation of patients at risk for COVID-19 are in a state of rapid change based on information released by regulatory bodies including the CDC and federal and state organizations. These policies and algorithms were followed during the patient's care.

## 2020-03-06 NOTE — ED Notes (Signed)
COVID positive

## 2020-03-06 NOTE — ED Triage Notes (Signed)
Pt BIBA from home. Pt c/o COVID symptoms- fever (1g of tylenol given en route), weakness, cough, body aches, fatigue. Denies SHOB. Started on Monday after coming back from [place].  Hx of left sided weakness and slow speech, which is normal- hx of stroke.   106/54 130 97% RA

## 2020-03-06 NOTE — ED Provider Notes (Signed)
[facility]-EMERGENCY DEPT Provider Note   CSN: [id number] Arrival date & time: 03/06/20  1313     History Chief Complaint  Patient presents with  . COVID Like Symptoms    Cameron Woodward is a 64 y.o. male with past medical history significant for CAD, hypertension, gout, CVA who presents for evaluation myalgias, cough and decreased appetite.  Has had chronic cough over the last year however worse over the last week.  Did not receive Covid vaccine.  Unknown sick contacts.  Had overall decreased appetite.  Had chills however is not taken his temperature.  No headache, lightheadedness, dizziness, chest pain, shortness of breath, abdominal pain, dysuria, lateral leg swelling, redness or warmth.  Had one episode of diarrhea in the waiting room without melena or bright red blood per rectum.  Has not taken anything for symptoms.  Denies additional aggravating or relieving factors.  History obtained from patient and past medical records.  No interpreter used.  HPI     Past Medical History:  Diagnosis Date  . Blood transfusion   . Coronary artery disease   . Gout   . High cholesterol   . Hypertension   . Seizures (HCC)   . Stroke [facility](HCC)     Patient Active Problem List   Diagnosis Date Noted  . Pneumonia due to COVID-19 virus 03/06/2020  . CAD (coronary artery disease) 03/06/2020  . AKI (acute kidney injury) (HCC) 03/06/2020  . History of ischemic stroke 03/06/2020  . Dehydration 03/06/2020  . Essential hypertension 03/06/2020  . Hyperlipidemia 03/06/2020    Past Surgical History:  Procedure Laterality Date  . CORONARY ANGIOPLASTY WITH STENT PLACEMENT    . TONSILLECTOMY         Family History  Problem Relation Age of Onset  . CAD Father     Social History   Tobacco Use  . Smoking status: Never Smoker  . Smokeless tobacco: Never Used  Substance Use Topics  . Alcohol use: Yes    Comment: occasional  . Drug use: No    Home Medications Prior  to Admission medications   Medication Sig Start Date End Date Taking? Authorizing Provider  allopurinol (ZYLOPRIM) 100 MG tablet Take 200 mg by mouth daily. 01/03/20  Yes [provider]  amLODipine (NORVASC) 10 MG tablet Take 10 mg by mouth daily.   Yes [provider]  aspirin 81 MG chewable tablet Chew 81 mg by mouth daily.   Yes [provider]  atorvastatin (LIPITOR) 40 MG tablet Take 40 mg by mouth daily.  02/14/20  Yes [provider]  cloNIDine (CATAPRES) 0.1 MG tablet Take 0.1 mg by mouth daily.  12/25/19  Yes [provider]  clopidogrel (PLAVIX) 75 MG tablet Take 75 mg by mouth daily. 02/14/20  Yes [provider]  lisinopril-hydrochlorothiazide (ZESTORETIC) 20-12.5 MG tablet Take 1 tablet by mouth 2 (two) times daily. 02/14/20  Yes [provider]  phenytoin (DILANTIN) 100 MG ER capsule Take 3 capsules (300 mg total) by mouth daily. Patient taking differently: Take 100 mg by mouth daily.  06/22/11 03/06/20 Yes Doug SouJacubowitz, Sam, MD    Allergies    Patient has no known allergies.  Review of Systems   Review of Systems  Constitutional: Positive for activity change, appetite change, chills and fatigue.  Respiratory: Positive for cough. Negative for apnea, choking, chest tightness, shortness of breath, wheezing and stridor.   Cardiovascular: Negative.   Gastrointestinal: Positive for diarrhea (1 episode) and nausea. Negative for abdominal  distention, abdominal pain, anal bleeding, blood in stool, constipation, rectal pain and vomiting.  Genitourinary: Negative.   Musculoskeletal: Negative.   Skin: Negative.   Neurological: Positive for weakness (Generalized).  All other systems reviewed and are negative.   Physical Exam Updated Vital Signs BP 131/78 (BP Location: Left Arm)   Pulse (!) 101   Temp 100.3 F (37.9 C) (Oral)   Resp (!) 22   SpO2 99%   Physical Exam Vitals and nursing note reviewed.  Constitutional:       General: He is not in acute distress.    Appearance: He is not ill-appearing, toxic-appearing or diaphoretic.  HENT:     Head: Normocephalic and atraumatic.     Jaw: There is normal jaw occlusion.     Right Ear: Tympanic membrane, ear canal and external ear normal. There is no impacted cerumen. No hemotympanum. Tympanic membrane is not injected, scarred, perforated, erythematous, retracted or bulging.     Left Ear: Tympanic membrane, ear canal and external ear normal. There is no impacted cerumen. No hemotympanum. Tympanic membrane is not injected, scarred, perforated, erythematous, retracted or bulging.     Ears:     Comments: No Mastoid tenderness.    Nose:     Comments: Clear rhinorrhea and congestion to bilateral nares.  No sinus tenderness.    Mouth/Throat:     Comments: Posterior oropharynx clear.  Mucous membranes moist.  Tonsils without erythema or exudate.  Uvula midline without deviation.  No evidence of PTA or RPA.  No drooling, dysphasia or trismus.  Phonation normal. Neck:     Trachea: Trachea and phonation normal.     Meningeal: Brudzinski's sign and Kernig's sign absent.     Comments: No Neck stiffness or neck rigidity.  No meningismus.  No cervical lymphadenopathy. Cardiovascular:     Rate and Rhythm: Tachycardia present.     Pulses: Normal pulses.     Heart sounds: Normal heart sounds.     Comments: No murmurs rubs or gallops. Pulmonary:     Effort: Pulmonary effort is normal.     Breath sounds: Normal breath sounds.     Comments: Clear to auscultation bilaterally without wheeze, rhonchi or rales.  No accessory muscle usage.  Able speak in full sentences. Abdominal:     General: Bowel sounds are normal. There is no distension.     Tenderness: There is no abdominal tenderness. There is no right CVA tenderness, left CVA tenderness, guarding or rebound.     Comments: Soft, nontender without rebound or guarding.  No CVA tenderness.  Musculoskeletal:        General: Normal  range of motion.     Comments: Moves all 4 extremities without difficulty.  Lower extremities without edema, erythema or warmth.  Skin:    General: Skin is warm.     Capillary Refill: Capillary refill takes less than 2 seconds.     Comments: Brisk capillary refill.  No rashes or lesions.  Neurological:     Mental Status: He is alert.     Comments: Ambulatory in department without difficulty.  Cranial nerves II through XII grossly intact.  No facial droop.  No aphasia.     ED Results / Procedures / Treatments   Labs (all labs ordered are listed, but only abnormal results are displayed) Labs Reviewed  SARS CORONAVIRUS 2 BY RT PCR (HOSPITAL ORDER, PERFORMED IN [facility]) - Abnormal; Notable for the following components:      Result Value  SARS Coronavirus 2 POSITIVE (*)    All other components within normal limits  CBC WITH DIFFERENTIAL/PLATELET - Abnormal; Notable for the following components:   Neutro Abs 8.3 (*)    All other components within normal limits  COMPREHENSIVE METABOLIC PANEL - Abnormal; Notable for the following components:   CO2 20 (*)    Glucose, Bld 114 (*)    BUN 54 (*)    Creatinine, Ser 3.15 (*)    Total Protein 8.3 (*)    GFR calc non Af Amer 20 (*)    GFR calc Af Amer 23 (*)    All other components within normal limits  D-DIMER, QUANTITATIVE (NOT AT [facility]) - Abnormal; Notable for the following components:   D-Dimer, Quant 2.40 (*)    All other components within normal limits  LACTATE DEHYDROGENASE - Abnormal; Notable for the following components:   LDH 254 (*)    All other components within normal limits  FERRITIN - Abnormal; Notable for the following components:   Ferritin 681 (*)    All other components within normal limits  TRIGLYCERIDES - Abnormal; Notable for the following components:   Triglycerides 220 (*)    All other components within normal limits  FIBRINOGEN - Abnormal; Notable for the following components:   Fibrinogen 674  (*)    All other components within normal limits  C-REACTIVE PROTEIN - Abnormal; Notable for the following components:   CRP 15.6 (*)    All other components within normal limits  URINALYSIS, ROUTINE W REFLEX MICROSCOPIC - Abnormal; Notable for the following components:   APPearance HAZY (*)    Hgb urine dipstick SMALL (*)    Protein, ur 30 (*)    Bacteria, UA RARE (*)    All other components within normal limits  PHENYTOIN LEVEL, TOTAL - Abnormal; Notable for the following components:   Phenytoin Lvl <2.5 (*)    All other components within normal limits  CULTURE, BLOOD (ROUTINE X 2)  CULTURE, BLOOD (ROUTINE X 2)  LACTIC ACID, PLASMA  LACTIC ACID, PLASMA  PROCALCITONIN  CREATININE, URINE, RANDOM  SODIUM, URINE, RANDOM  TROPONIN I (HIGH SENSITIVITY)    EKG EKG Interpretation  Date/Time:  Wednesday March 06 2020 17:07:23 EDT Ventricular Rate:  106 PR Interval:    QRS Duration: 100 QT Interval:  316 QTC Calculation: 420 R Axis:   59 Text Interpretation: Sinus tachycardia Borderline T abnormalities, inferior leads Since prior ECG, rate has increased Confirmed by Alvira Monday ([id number]) on 03/06/2020 6:54:32 PM   Radiology DG Chest 2 View  Result Date: 03/06/2020 CLINICAL DATA:  Feeling sick and weak since Monday. EXAM: CHEST - 2 VIEW COMPARISON:  10/03/2015 FINDINGS: The cardiac silhouette, mediastinal and hilar contours are within normal limits. There appears to be some type of atrial closure device. Somewhat rounded density in the right mid lung is not well seen on the lateral film. It is possible this could be an infiltrate or a pulmonary lesion. Chest CT with contrast is suggested for further evaluation. No pleural effusions. The bony thorax is intact. IMPRESSION: Rounded density in the right mid lung. Chest CT with contrast is suggested for further evaluation. Electronically Signed   By: Rudie Meyer M.D.   On: 03/06/2020 14:48   CT Chest Wo Contrast  Result Date:  03/06/2020 CLINICAL DATA:  Cough and fever.  COVID positive.  Weakness. EXAM: CT CHEST WITHOUT CONTRAST TECHNIQUE: Multidetector CT imaging of the chest was performed following the standard protocol without IV contrast. COMPARISON:  Radiograph earlier this day. FINDINGS: Cardiovascular: The thoracic aorta is normal in caliber. Presumed atrial septal closure device. The heart is normal in size. There is no pericardial effusion. Trace coronary artery calcifications. Mediastinum/Nodes: No enlarged mediastinal lymph nodes. No evidence of hilar adenopathy allowing for lack of IV contrast. No esophageal wall thickening. No visualized thyroid nodule. Lungs/Pleura: Multifocal bilateral geographic ground-glass opacities throughout both lungs, more prominent on the right. There is a slight superimposed consolidative component in the upper lobe. One of these opacities may have been the etiology of the rounded density on radiograph, there is no evidence of solid pulmonary nodule or mass. No pleural fluid. No pneumothorax. Upper Abdomen: Decreased hepatic density consistent with steatosis. Small cyst in the left kidney is partially included. Musculoskeletal: Multilevel degenerative change throughout the spine. There are no acute or suspicious osseous abnormalities. IMPRESSION: 1. Multifocal bilateral geographic ground-glass opacities throughout both lungs, more prominent on the right. There is a slight superimposed consolidative component in the upper lobe. Findings most consistent with multifocal pneumonia, pattern typical of COVID-19. 2. No suspicious pulmonary nodule or mass corresponding to that seen on radiograph. Radiographic appearance may have been related to underlying ground-glass opacity mentioned above. 3. Incidental note of hepatic steatosis in the upper abdomen. Aortic Atherosclerosis (ICD10-I70.0). Electronically Signed   By: Narda Rutherford M.D.   On: 03/06/2020 19:49   Procedures Procedures (including  critical care time)  Medications Ordered in ED Medications  remdesivir 200 mg in sodium chloride 0.9% 250 mL IVPB (200 mg Intravenous New Bag/Given 03/06/20 2252)    Followed by  remdesivir 100 mg in sodium chloride 0.9 % 100 mL IVPB (has no administration in time range)  acetaminophen (TYLENOL) tablet 650 mg (650 mg Oral Given 03/06/20 2119)  sodium chloride 0.9 % bolus 500 mL (0 mLs Intravenous Stopped 03/06/20 2119)  0.9 %  sodium chloride infusion ( Intravenous New Bag/Given (Non-Interop) 03/06/20 2129)    ED Course  I have reviewed the triage vital signs and the nursing notes.  Pertinent labs & imaging results that were available during my care of the patient were reviewed by me and considered in my medical decision making (see chart for details).  64 year old presents for evaluation of myalgias, cough and generalized weakness.  He is febrile and tachycardic.  Did not receive COVID vaccine.  Heart and lungs clear.  Abdomen soft, non-tender. No unilateral leg swelling, redness or warmth.  1 episode of diarrhea at the waiting room without melena or bright blood per rectum.  Patient with nonfocal neuro exam without deficits.  Plan on labs, imaging and reassess.  Held calling  Labs and imaging personally reviewed and interpreted:  CBC without leukocytosis, hemoglobin 13.4 Covid positive Metabolic panel with mild hyperglycemia to 114, BUN 54, creatinine 3.15, previous 1.2-1.5, GFR 23 D-dimer 2.40, unable to obtain CTA chest due to AKI.  VQ scan, patient denies any shortness of breath. Lactic acid 1.8 Elevated Inflammatory markers Plain film chest with possible right lower lobe consolidation recommend CT scan CT chest with multifocal pneumonia. EKG without STEMI UA pending however denies dysuria  Patient reassessed.  Likely AKI from decreased p.o. intake due to generalized weakness.  Patient with downtrending of fever with Tylenol.  He is still mildly tachycardic to 110.  Will admit for  AKI with known positive Covid status.  Low suspicion for acute intraabdominal process such as renal stone, infectious process causing patient's AKI.  Pending bladder scan to assess for bladder outlet obstruction however patient denies  flank pain, abd pain.  Feel AKI likely due to decreased p.o. intake due to weakness from Covid infection  CONSULT with Dr. Adela Glimpse with [facility] who agrees to evaluate for admission  The patient appears reasonably stabilized for admission considering the current resources, flow, and capabilities available in the ED at this time, and I doubt any other [facility] requiring further screening and/or treatment in the ED prior to admission.  Patient discussed with attending Dr. Dalene Seltzer who agrees with above treatment, plan and disposition.    MDM Rules/Calculators/A&P                          Kamarion Zagami was evaluated in Emergency Department on 03/06/2020 for the symptoms described in the history of present illness. He was evaluated in the context of the global COVID-19 pandemic, which necessitated consideration that the patient might be at risk for infection with the SARS-CoV-2 virus that causes COVID-19. Institutional protocols and algorithms that pertain to the evaluation of patients at risk for COVID-19 are in a state of rapid change based on information released by regulatory bodies including the CDC and federal and state organizations. These policies and algorithms were followed during the patient's care in the ED. Final Clinical Impression(s) / ED Diagnoses Final diagnoses:  COVID-19  AKI (acute kidney injury) (HCC)  Elevated d-dimer    Rx / DC Orders ED Discharge Orders    None       Savir Blanke A, PA-C 03/06/20 2303    Alvira Monday, MD 03/07/20 1617

## 2020-03-07 LAB — CBC WITH DIFFERENTIAL/PLATELET
Abs Immature Granulocytes: 0.03 10*3/uL (ref 0.00–0.07)
Basophils Absolute: 0 10*3/uL (ref 0.0–0.1)
Basophils Relative: 0 %
Eosinophils Absolute: 0 10*3/uL (ref 0.0–0.5)
Eosinophils Relative: 0 %
HCT: 38.8 % — ABNORMAL LOW (ref 39.0–52.0)
Hemoglobin: 12.8 g/dL — ABNORMAL LOW (ref 13.0–17.0)
Immature Granulocytes: 0 %
Lymphocytes Relative: 20 %
Lymphs Abs: 1.8 10*3/uL (ref 0.7–4.0)
MCH: 30.8 pg (ref 26.0–34.0)
MCHC: 33 g/dL (ref 30.0–36.0)
MCV: 93.5 fL (ref 80.0–100.0)
Monocytes Absolute: 0.4 10*3/uL (ref 0.1–1.0)
Monocytes Relative: 4 %
Neutro Abs: 6.7 10*3/uL (ref 1.7–7.7)
Neutrophils Relative %: 76 %
Platelets: 193 10*3/uL (ref 150–400)
RBC: 4.15 MIL/uL — ABNORMAL LOW (ref 4.22–5.81)
RDW: 13.5 % (ref 11.5–15.5)
WBC: 8.9 10*3/uL (ref 4.0–10.5)
nRBC: 0 % (ref 0.0–0.2)

## 2020-03-07 LAB — COMPREHENSIVE METABOLIC PANEL
ALT: 43 U/L (ref 0–44)
AST: 52 U/L — ABNORMAL HIGH (ref 15–41)
Albumin: 3.8 g/dL (ref 3.5–5.0)
Alkaline Phosphatase: 75 U/L (ref 38–126)
Anion gap: 16 — ABNORMAL HIGH (ref 5–15)
BUN: 54 mg/dL — ABNORMAL HIGH (ref 8–23)
CO2: 18 mmol/L — ABNORMAL LOW (ref 22–32)
Calcium: 8.5 mg/dL — ABNORMAL LOW (ref 8.9–10.3)
Chloride: 106 mmol/L (ref 98–111)
Creatinine, Ser: 2.55 mg/dL — ABNORMAL HIGH (ref 0.61–1.24)
GFR calc Af Amer: 30 mL/min — ABNORMAL LOW (ref >60)
GFR calc non Af Amer: 26 mL/min — ABNORMAL LOW (ref >60)
Glucose, Bld: 126 mg/dL — ABNORMAL HIGH (ref 70–99)
Potassium: 3.7 mmol/L (ref 3.5–5.1)
Sodium: 140 mmol/L (ref 135–145)
Total Bilirubin: 0.5 mg/dL (ref 0.3–1.2)
Total Protein: 7.3 g/dL (ref 6.5–8.1)

## 2020-03-07 LAB — MAGNESIUM: Magnesium: 1.8 mg/dL (ref 1.7–2.4)

## 2020-03-07 LAB — HIV ANTIBODY (ROUTINE TESTING W REFLEX): HIV Screen 4th Generation wRfx: NONREACTIVE

## 2020-03-07 LAB — TROPONIN I (HIGH SENSITIVITY): Troponin I (High Sensitivity): 12 ng/L (ref <18)

## 2020-03-07 LAB — D-DIMER, QUANTITATIVE: D-Dimer, Quant: 1.6 ug/mL-FEU — ABNORMAL HIGH (ref 0.00–0.50)

## 2020-03-07 MED ORDER — HYDROCODONE-ACETAMINOPHEN 5-325 MG PO TABS
1.0000 | ORAL_TABLET | ORAL | Status: DC | PRN
  Administered 2020-03-09 – 2020-03-10 (×2): 2 via ORAL
  Filled 2020-03-07 (×2): qty 2

## 2020-03-07 MED ORDER — ONDANSETRON HCL 4 MG/2ML IJ SOLN
4.0000 mg | Freq: Four times a day (QID) | INTRAMUSCULAR | Status: DC | PRN
  Administered 2020-03-10: 4 mg via INTRAVENOUS
  Filled 2020-03-07: qty 2

## 2020-03-07 MED ORDER — GUAIFENESIN-DM 100-10 MG/5ML PO SYRP: 10.0000 mL | ORAL_SOLUTION | ORAL | Status: DC | PRN

## 2020-03-07 MED ORDER — PHENYTOIN SODIUM EXTENDED 100 MG PO CAPS
300.0000 mg | ORAL_CAPSULE | Freq: Every day | ORAL | Status: DC
  Administered 2020-03-07: 300 mg via ORAL
  Filled 2020-03-07: qty 3

## 2020-03-07 MED ORDER — CLOPIDOGREL BISULFATE 75 MG PO TABS
75.0000 mg | ORAL_TABLET | Freq: Every day | ORAL | Status: DC
  Administered 2020-03-07 – 2020-03-10 (×4): 75 mg via ORAL
  Filled 2020-03-07 (×6): qty 1

## 2020-03-07 MED ORDER — SODIUM CHLORIDE 0.9% FLUSH
3.0000 mL | Freq: Two times a day (BID) | INTRAVENOUS | Status: DC
  Administered 2020-03-08 – 2020-03-10 (×5): 3 mL via INTRAVENOUS

## 2020-03-07 MED ORDER — DEXAMETHASONE SODIUM PHOSPHATE 10 MG/ML IJ SOLN
6.0000 mg | INTRAMUSCULAR | Status: DC
  Administered 2020-03-07 – 2020-03-10 (×4): 6 mg via INTRAVENOUS
  Filled 2020-03-07 (×4): qty 1

## 2020-03-07 MED ORDER — SODIUM CHLORIDE 0.9 % IV SOLN
INTRAVENOUS | Status: DC
  Administered 2020-03-08: 50 mL/h via INTRAVENOUS

## 2020-03-07 MED ORDER — AZITHROMYCIN 250 MG PO TABS
500.0000 mg | ORAL_TABLET | Freq: Every day | ORAL | Status: DC
  Administered 2020-03-07 – 2020-03-08 (×2): 500 mg via ORAL
  Filled 2020-03-07 (×3): qty 2

## 2020-03-07 MED ORDER — ATORVASTATIN CALCIUM 40 MG PO TABS
40.0000 mg | ORAL_TABLET | Freq: Every day | ORAL | Status: DC
  Administered 2020-03-07 – 2020-03-10 (×4): 40 mg via ORAL
  Filled 2020-03-07 (×4): qty 1

## 2020-03-07 MED ORDER — ENOXAPARIN SODIUM 30 MG/0.3ML ~~LOC~~ SOLN
30.0000 mg | SUBCUTANEOUS | Status: DC
  Administered 2020-03-07 – 2020-03-08 (×2): 30 mg via SUBCUTANEOUS
  Filled 2020-03-07 (×2): qty 0.3

## 2020-03-07 MED ORDER — SODIUM CHLORIDE 0.9 % IV SOLN
2.0000 g | INTRAVENOUS | Status: DC
  Administered 2020-03-07 – 2020-03-08 (×2): 2 g via INTRAVENOUS
  Filled 2020-03-07 (×3): qty 20

## 2020-03-07 MED ORDER — SODIUM CHLORIDE 0.9 % IV SOLN: 100.0000 mg | Freq: Every day | INTRAVENOUS | Status: DC

## 2020-03-07 MED ORDER — ONDANSETRON HCL 4 MG PO TABS
4.0000 mg | ORAL_TABLET | Freq: Four times a day (QID) | ORAL | Status: DC | PRN
  Administered 2020-03-10: 4 mg via ORAL
  Filled 2020-03-07: qty 1

## 2020-03-07 MED ORDER — ZINC SULFATE 220 (50 ZN) MG PO CAPS
220.0000 mg | ORAL_CAPSULE | Freq: Every day | ORAL | Status: DC
  Administered 2020-03-07 – 2020-03-10 (×4): 220 mg via ORAL
  Filled 2020-03-07 (×4): qty 1

## 2020-03-07 MED ORDER — MELATONIN 3 MG PO TABS
6.0000 mg | ORAL_TABLET | Freq: Every evening | ORAL | Status: DC | PRN
  Administered 2020-03-07 – 2020-03-09 (×2): 6 mg via ORAL
  Filled 2020-03-07 (×2): qty 2

## 2020-03-07 MED ORDER — PHENYTOIN SODIUM EXTENDED 100 MG PO CAPS
200.0000 mg | ORAL_CAPSULE | Freq: Every day | ORAL | Status: DC
  Administered 2020-03-08 – 2020-03-10 (×3): 200 mg via ORAL
  Filled 2020-03-07 (×3): qty 2

## 2020-03-07 MED ORDER — SODIUM CHLORIDE 0.9 % IV SOLN: 200.0000 mg | Freq: Once | INTRAVENOUS | Status: DC

## 2020-03-07 MED ORDER — ASCORBIC ACID 500 MG PO TABS
500.0000 mg | ORAL_TABLET | Freq: Every day | ORAL | Status: DC
  Administered 2020-03-07 – 2020-03-10 (×4): 500 mg via ORAL
  Filled 2020-03-07 (×4): qty 1

## 2020-03-07 MED ORDER — OXYCODONE-ACETAMINOPHEN 5-325 MG PO TABS: 1.0000 | ORAL_TABLET | Freq: Four times a day (QID) | ORAL | Status: DC | PRN

## 2020-03-07 MED ORDER — FAMOTIDINE 20 MG PO TABS
20.0000 mg | ORAL_TABLET | Freq: Every day | ORAL | Status: DC
  Administered 2020-03-07 – 2020-03-10 (×4): 20 mg via ORAL
  Filled 2020-03-07 (×4): qty 1

## 2020-03-07 MED ORDER — ACETAMINOPHEN 325 MG PO TABS
650.0000 mg | ORAL_TABLET | Freq: Four times a day (QID) | ORAL | Status: DC | PRN
  Administered 2020-03-07: 650 mg via ORAL
  Filled 2020-03-07: qty 2

## 2020-03-07 MED ORDER — COLCHICINE 0.6 MG PO TABS
0.6000 mg | ORAL_TABLET | Freq: Every day | ORAL | Status: DC
  Administered 2020-03-07: 0.6 mg via ORAL
  Filled 2020-03-07: qty 1

## 2020-03-07 NOTE — ED Notes (Signed)
Hospitalist at bedside 

## 2020-03-07 NOTE — ED Notes (Signed)
Pt using bedside commode; will draw AM labs and give decadron as ordered when pt done using restroom to maintain pt privacy.

## 2020-03-07 NOTE — ED Notes (Signed)
Spoke to patient's daughter.  Update provided.  Hospitalist is aware she would like updates, as well.

## 2020-03-07 NOTE — ED Notes (Signed)
Cameron Woodward, daughter, said father lives with her. She would like an update, father had a stroke and doesn't understand what's going on. [phone]

## 2020-03-07 NOTE — Progress Notes (Signed)
PROGRESS NOTE  Cameron Woodward WGN:[id number] DOB: September 14, 1955 DOA: 03/06/2020  PCP: Patient, No Pcp Per  Brief History/Interval Summary: 64 y.o. male with medical history significant for CVA with right -sided weakness, CAD, HTN, gout, history of seizures who presented with fever, fatigue, cough. Patient was noted to be tachycardic at initial presentation. He has not been vaccinated against COVID-19. COVID-19 test did come back positive. A CT chest was done which showed evidence for multifocal pneumonia. Patient was hospitalized for further management.   Reason for Visit: Pneumonia due to COVID-19.  Consultants: None  Procedures: None  Antibiotics: Anti-infectives (From admission, onward)   Start     Dose/Rate Route Frequency Ordered Stop   03/08/20 1000  remdesivir 100 mg in sodium chloride 0.9 % 100 mL IVPB  Status:  Discontinued       "Followed by" Linked Group Details   100 mg 200 mL/hr over 30 Minutes Intravenous Daily 03/07/20 0404 03/07/20 0406   03/07/20 1000  remdesivir 100 mg in sodium chloride 0.9 % 100 mL IVPB       "Followed by" Linked Group Details   100 mg 200 mL/hr over 30 Minutes Intravenous Daily 03/06/20 2225 03/11/20 0959   03/07/20 0415  remdesivir 200 mg in sodium chloride 0.9% 250 mL IVPB  Status:  Discontinued       "Followed by" Linked Group Details   200 mg 580 mL/hr over 30 Minutes Intravenous Once 03/07/20 0404 03/07/20 0406   03/06/20 2230  remdesivir 200 mg in sodium chloride 0.9% 250 mL IVPB       "Followed by" Linked Group Details   200 mg 580 mL/hr over 30 Minutes Intravenous Once 03/06/20 2225 03/07/20 0100      Subjective/Interval History: Patient seems to be slightly distracted. Complains of some cough and minimal shortness of breath. No chest pain. Some nausea but no vomiting. Denies any abdominal pain.  ROS: Denies any headaches    Assessment/Plan:  Pneumonia due to COVID-19   Recent Labs  Lab 03/06/20 1754 03/07/20 0502   DDIMER 2.40* 1.60*  FERRITIN 681*  --   CRP 15.6*  --   ALT 35 43  PROCALCITON 0.81  --     Objective findings: Fever: Temperature of 101.1 earlier this morning. Oxygen requirements: On room air saturating in the mid 90s  COVID 19 Therapeutics: Antibacterials: None. However procalcitonin noted to be 0.81. Initiate ceftriaxone and azithromycin. Remdesivir: Day 2 Steroids: Dexamethasone 6 mg daily. Diuretics: Not on a scheduled basis Actemra/Baricitinib: None PUD Prophylaxis: Initiate Pepcid DVT Prophylaxis:  Lovenox   Patient does not have any oxygen requirements currently. He is saturating normal on room air. Patient remains on Remdesivir and steroids. Procalcitonin noted to be elevated. His WBC is normal. Patient does have several comorbidities. We will place him on antibacterials for 5 days. Inflammatory markers noted to be elevated. We will continue to trend. D-dimer was 2.4 yesterday and noted to be 1.6 today. Continue to mobilize as much as possible. Incentive spirometry. Prone positioning.  The treatment plan and use of medications and known side effects were discussed with patient/family. Some of the medications used are based on case reports/anecdotal data.  All other medications being used in the management of COVID-19 based on limited study data.  Complete risks and long-term side effects are unknown, however in the best clinical judgment they seem to be of some benefit.  Patient/family wanted to proceed with treatment options provided.  Acute kidney injury Came in with a BUN  of 54 and creatinine of 3.15. Most likely due to hypovolemia. Patient hydrated. Improved to 2.55 today. Continue IV fluids. Recheck labs tomorrow. Monitor urine output.  History of coronary artery disease Seems to be stable. Continue Plavix statin.  History of stroke Apparently has right-sided weakness although patient mentions that he is able to ambulate without any difficulty. Seems to be stable.  Continue statin.  Essential hypertension Monitor blood pressures. Holding lisinopril.  History of seizure disorder Continue Dilantin.  Hyperlipidemia Continue statin.  Obesity Estimated body mass index is 30.17 kg/m as calculated from the following:   Height as of 03/20/18: 6\' 2"  (1.88 m).   Weight as of 03/20/18: 106.6 kg.   DVT Prophylaxis: Lovenox Code Status: Full code Family Communication: Discussed with the patient. Will update his daughter Disposition Plan: Hopefully return home when improved  Status is: Inpatient  Remains inpatient appropriate because:IV treatments appropriate due to intensity of illness or inability to take PO and Inpatient level of care appropriate due to severity of illness   Dispo: The patient is from: Home              Anticipated d/c is to: Home              Anticipated d/c date is: 2 days              Patient currently is not medically stable to d/c.      Medications:  Scheduled: . vitamin C  500 mg Oral Daily  . atorvastatin  40 mg Oral Daily  . clopidogrel  75 mg Oral Q breakfast  . dexamethasone (DECADRON) injection  6 mg Intravenous Q24H  . enoxaparin (LOVENOX) injection  30 mg Subcutaneous Q24H  . phenytoin  300 mg Oral Daily  . sodium chloride flush  3 mL Intravenous Q12H  . zinc sulfate  220 mg Oral Daily   Continuous: . sodium chloride 75 mL/hr at 03/07/20 0518  . remdesivir 100 mg in NS 100 mL 100 mg (03/07/20 0932)   03/09/20, guaiFENesin-dextromethorphan, HYDROcodone-acetaminophen, ondansetron **OR** ondansetron (ZOFRAN) IV, oxyCODONE-acetaminophen   Objective:  Vital Signs  Vitals:   03/07/20 0728 03/07/20 0800 03/07/20 0848 03/07/20 1106  BP: 119/75 119/81 119/75 121/76  Pulse: 85 84 92 91  Resp: (!) 30 (!) 23 12 14   Temp:   98.2 F (36.8 C)   TempSrc:   Oral   SpO2: 95% 95% 97% 98%    Intake/Output Summary (Last 24 hours) at 03/07/2020 1151 Last data filed at 03/07/2020 0100 Gross per 24 hour   Intake 792.33 ml  Output 50 ml  Net 742.33 ml   There were no vitals filed for this visit.  General appearance: Awake alert.  In no distress Resp: Normal effort at rest. Few crackles at the bases. No wheezing or rhonchi. Cardio: S1-S2 is normal regular.  No S3-S4.  No rubs murmurs or bruit GI: Abdomen is soft.  Nontender nondistended.  Bowel sounds are present normal.  No masses organomegaly Extremities: No edema.  Full range of motion of lower extremities. Neurologic:  No focal neurological deficits.    Lab Results:  Data Reviewed: I have personally reviewed following labs and imaging studies  CBC: Recent Labs  Lab 03/06/20 1754 03/07/20 0502  WBC 10.2 8.9  NEUTROABS 8.3* 6.7  HGB 13.4 12.8*  HCT 40.3 38.8*  MCV 90.8 93.5  PLT 233 193    Basic Metabolic Panel: Recent Labs  Lab 03/06/20 1754 03/07/20 0502  NA 139 140  K 3.9 3.7  CL 104 106  CO2 20* 18*  GLUCOSE 114* 126*  BUN 54* 54*  CREATININE 3.15* 2.55*  CALCIUM 9.0 8.5*  MG  --  1.8    GFR: CrCl cannot be calculated (Unknown ideal weight.).  Liver Function Tests: Recent Labs  Lab 03/06/20 1754 03/07/20 0502  AST 39 52*  ALT 35 43  ALKPHOS 84 75  BILITOT 0.6 0.5  PROT 8.3* 7.3  ALBUMIN 4.3 3.8    Lipid Profile: Recent Labs    03/06/20 1754  TRIG 220*    Anemia Panel: Recent Labs    03/06/20 1754  FERRITIN 681*    Recent Results (from the past 240 hour(s))  SARS Coronavirus 2 by RT PCR (hospital order, performed in [facility]) Nasopharyngeal Nasopharyngeal Swab     Status: Abnormal   Collection Time: 03/06/20  1:24 PM   Specimen: Nasopharyngeal Swab  Result Value Ref Range Status   SARS Coronavirus 2 POSITIVE (A) NEGATIVE Final    Comment: RESULT CALLED TO, READ BACK BY AND VERIFIED WITH: A DECHAMPAIN AT 1524 ON 03/06/2020 BY MOSLEY,J (NOTE) SARS-CoV-2 target nucleic acids are DETECTED  SARS-CoV-2 RNA is generally detectable in upper respiratory specimens   during the acute phase of infection.  Positive results are indicative  of the presence of the identified virus, but do not rule out bacterial infection or co-infection with other pathogens not detected by the test.  Clinical correlation with patient history and  other diagnostic information is necessary to determine patient infection status.  The expected result is negative.  Fact Sheet for Patients:   BoilerBrush.com.cyhttps://www.fda.gov/media/136312/download   Fact Sheet for Healthcare Providers:   https://pope.com/https://www.fda.gov/media/136313/download    This test is not yet approved or cleared by the [place] FDA and  has been authorized for detection and/or diagnosis of SARS-CoV-2 by FDA under an Emergency Use Authorization (EUA).  This EUA will remain in effect (mean ing this test can be used) for the duration of  the COVID-19 declaration under Section 564(b)(1) of the Act, 21 U.S.C. section 360-bbb-3(b)(1), unless the authorization is terminated or revoked sooner.  Performed at [facility], [address] [place], [place] [address]   Blood Culture (routine x 2)     Status: None (Preliminary result)   Collection Time: 03/06/20  5:54 PM   Specimen: BLOOD LEFT HAND  Result Value Ref Range Status   Specimen Description   Final    BLOOD LEFT HAND Performed at [facility], [address] [place], [place] [address]    Special Requests   Final    BOTTLES DRAWN AEROBIC AND ANAEROBIC Blood Culture results may not be optimal due to an inadequate volume of blood received in culture bottles Performed at [facility], [address] [place], [place] [address]    Culture   Final    NO GROWTH < 12 HOURS Performed at [facility], [address] [place], [place] [address]    Report Status PENDING  Incomplete  Blood Culture (routine x 2)     Status: None (Preliminary result)   Collection Time: 03/06/20  5:54 PM   Specimen: BLOOD  Result  Value Ref Range Status   Specimen Description   Final    BLOOD RIGHT ANTECUBITAL Performed at [facility], [address] [place], [place] [address]    Special Requests   Final    BOTTLES DRAWN AEROBIC AND ANAEROBIC Blood Culture  results may not be optimal due to an inadequate volume of blood received in culture bottles Performed at [facility], [address], [place], [place] [address]    Culture   Final    NO GROWTH < 12 HOURS Performed at [facility], [address] [place], [place] [address]    Report Status PENDING  Incomplete      Radiology Studies: DG Chest 2 View  Result Date: 03/06/2020 CLINICAL DATA:  Feeling sick and weak since Monday. EXAM: CHEST - 2 VIEW COMPARISON:  10/03/2015 FINDINGS: The cardiac silhouette, mediastinal and hilar contours are within normal limits. There appears to be some type of atrial closure device. Somewhat rounded density in the right mid lung is not well seen on the lateral film. It is possible this could be an infiltrate or a pulmonary lesion. Chest CT with contrast is suggested for further evaluation. No pleural effusions. The bony thorax is intact. IMPRESSION: Rounded density in the right mid lung. Chest CT with contrast is suggested for further evaluation. Electronically Signed   By: Rudie Meyer M.D.   On: 03/06/2020 14:48   CT Chest Wo Contrast  Result Date: 03/06/2020 CLINICAL DATA:  Cough and fever.  COVID positive.  Weakness. EXAM: CT CHEST WITHOUT CONTRAST TECHNIQUE: Multidetector CT imaging of the chest was performed following the standard protocol without IV contrast. COMPARISON:  Radiograph earlier this day. FINDINGS: Cardiovascular: The thoracic aorta is normal in caliber. Presumed atrial septal closure device. The heart is normal in size. There is no pericardial effusion. Trace coronary artery calcifications. Mediastinum/Nodes: No enlarged mediastinal lymph nodes. No evidence of hilar  adenopathy allowing for lack of IV contrast. No esophageal wall thickening. No visualized thyroid nodule. Lungs/Pleura: Multifocal bilateral geographic ground-glass opacities throughout both lungs, more prominent on the right. There is a slight superimposed consolidative component in the upper lobe. One of these opacities may have been the etiology of the rounded density on radiograph, there is no evidence of solid pulmonary nodule or mass. No pleural fluid. No pneumothorax. Upper Abdomen: Decreased hepatic density consistent with steatosis. Small cyst in the left kidney is partially included. Musculoskeletal: Multilevel degenerative change throughout the spine. There are no acute or suspicious osseous abnormalities. IMPRESSION: 1. Multifocal bilateral geographic ground-glass opacities throughout both lungs, more prominent on the right. There is a slight superimposed consolidative component in the upper lobe. Findings most consistent with multifocal pneumonia, pattern typical of COVID-19. 2. No suspicious pulmonary nodule or mass corresponding to that seen on radiograph. Radiographic appearance may have been related to underlying ground-glass opacity mentioned above. 3. Incidental note of hepatic steatosis in the upper abdomen. Aortic Atherosclerosis (ICD10-I70.0). Electronically Signed   By: Narda Rutherford M.D.   On: 03/06/2020 19:49       LOS: 1 day   Cameron Woodward  [facility] Pager on [email or web address]  03/07/2020, 11:51 AM

## 2020-03-07 NOTE — ED Notes (Signed)
assisted pt with adjustment and comfort in bed. Reinforced use of incentive spirometer. Pt comfortable and resting at this time.

## 2020-03-08 LAB — PROCALCITONIN: Procalcitonin: 0.32 ng/mL

## 2020-03-08 LAB — MAGNESIUM: Magnesium: 1.9 mg/dL (ref 1.7–2.4)

## 2020-03-08 LAB — CBC WITH DIFFERENTIAL/PLATELET
Abs Immature Granulocytes: 0.03 10*3/uL (ref 0.00–0.07)
Basophils Absolute: 0 10*3/uL (ref 0.0–0.1)
Basophils Relative: 0 %
Eosinophils Absolute: 0 10*3/uL (ref 0.0–0.5)
Eosinophils Relative: 1 %
HCT: 33 % — ABNORMAL LOW (ref 39.0–52.0)
Hemoglobin: 11.1 g/dL — ABNORMAL LOW (ref 13.0–17.0)
Immature Granulocytes: 0 %
Lymphocytes Relative: 21 %
Lymphs Abs: 1.8 10*3/uL (ref 0.7–4.0)
MCH: 30.7 pg (ref 26.0–34.0)
MCHC: 33.6 g/dL (ref 30.0–36.0)
MCV: 91.4 fL (ref 80.0–100.0)
Monocytes Absolute: 0.4 10*3/uL (ref 0.1–1.0)
Monocytes Relative: 5 %
Neutro Abs: 6.2 10*3/uL (ref 1.7–7.7)
Neutrophils Relative %: 73 %
Platelets: 211 10*3/uL (ref 150–400)
RBC: 3.61 MIL/uL — ABNORMAL LOW (ref 4.22–5.81)
RDW: 13.2 % (ref 11.5–15.5)
WBC: 8.5 10*3/uL (ref 4.0–10.5)
nRBC: 0 % (ref 0.0–0.2)

## 2020-03-08 LAB — COMPREHENSIVE METABOLIC PANEL
ALT: 64 U/L — ABNORMAL HIGH (ref 0–44)
AST: 75 U/L — ABNORMAL HIGH (ref 15–41)
Albumin: 3.4 g/dL — ABNORMAL LOW (ref 3.5–5.0)
Alkaline Phosphatase: 65 U/L (ref 38–126)
Anion gap: 13 (ref 5–15)
BUN: 50 mg/dL — ABNORMAL HIGH (ref 8–23)
CO2: 19 mmol/L — ABNORMAL LOW (ref 22–32)
Calcium: 8.6 mg/dL — ABNORMAL LOW (ref 8.9–10.3)
Chloride: 104 mmol/L (ref 98–111)
Creatinine, Ser: 1.83 mg/dL — ABNORMAL HIGH (ref 0.61–1.24)
GFR calc Af Amer: 44 mL/min — ABNORMAL LOW (ref >60)
GFR calc non Af Amer: 38 mL/min — ABNORMAL LOW (ref >60)
Glucose, Bld: 106 mg/dL — ABNORMAL HIGH (ref 70–99)
Potassium: 3.8 mmol/L (ref 3.5–5.1)
Sodium: 136 mmol/L (ref 135–145)
Total Bilirubin: 0.5 mg/dL (ref 0.3–1.2)
Total Protein: 6.7 g/dL (ref 6.5–8.1)

## 2020-03-08 LAB — D-DIMER, QUANTITATIVE: D-Dimer, Quant: 1.27 ug/mL-FEU — ABNORMAL HIGH (ref 0.00–0.50)

## 2020-03-08 LAB — C-REACTIVE PROTEIN: CRP: 13.8 mg/dL — ABNORMAL HIGH (ref <1.0)

## 2020-03-08 MED ORDER — ENOXAPARIN SODIUM 40 MG/0.4ML ~~LOC~~ SOLN
40.0000 mg | SUBCUTANEOUS | Status: DC
  Administered 2020-03-09 – 2020-03-10 (×2): 40 mg via SUBCUTANEOUS
  Filled 2020-03-08 (×2): qty 0.4

## 2020-03-08 MED ORDER — LOPERAMIDE HCL 2 MG PO CAPS
4.0000 mg | ORAL_CAPSULE | Freq: Once | ORAL | Status: AC
  Administered 2020-03-08: 4 mg via ORAL
  Filled 2020-03-08: qty 2

## 2020-03-08 NOTE — Progress Notes (Signed)
PROGRESS NOTE  Cameron Woodward VQQ:[id number] DOB: 08/05/1955 DOA: 03/06/2020  PCP: Patient, No Pcp Per  Brief History/Interval Summary: 64 y.o. male with medical history significant for CVA with right -sided weakness, CAD, HTN, gout, history of seizures who presented with fever, fatigue, cough. Patient was noted to be tachycardic at initial presentation. He has not been vaccinated against COVID-19. COVID-19 test did come back positive. A CT chest was done which showed evidence for multifocal pneumonia. Patient was hospitalized for further management.   Reason for Visit: Pneumonia due to COVID-19.  Consultants: None  Procedures: None  Antibiotics: Anti-infectives (From admission, onward)   Start     Dose/Rate Route Frequency Ordered Stop   03/08/20 1000  remdesivir 100 mg in sodium chloride 0.9 % 100 mL IVPB  Status:  Discontinued       "Followed by" Linked Group Details   100 mg 200 mL/hr over 30 Minutes Intravenous Daily 03/07/20 0404 03/07/20 0406   03/07/20 1300  cefTRIAXone (ROCEPHIN) 2 g in sodium chloride 0.9 % 100 mL IVPB        2 g 200 mL/hr over 30 Minutes Intravenous Every 24 hours 03/07/20 1203 03/12/20 1259   03/07/20 1300  azithromycin (ZITHROMAX) tablet 500 mg        500 mg Oral Daily 03/07/20 1203 03/12/20 0959   03/07/20 1000  remdesivir 100 mg in sodium chloride 0.9 % 100 mL IVPB       "Followed by" Linked Group Details   100 mg 200 mL/hr over 30 Minutes Intravenous Daily 03/06/20 2225 03/11/20 0959   03/07/20 0415  remdesivir 200 mg in sodium chloride 0.9% 250 mL IVPB  Status:  Discontinued       "Followed by" Linked Group Details   200 mg 580 mL/hr over 30 Minutes Intravenous Once 03/07/20 0404 03/07/20 0406   03/06/20 2230  remdesivir 200 mg in sodium chloride 0.9% 250 mL IVPB       "Followed by" Linked Group Details   200 mg 580 mL/hr over 30 Minutes Intravenous Once 03/06/20 2225 03/07/20 0100      Subjective/Interval History: Patient states that he  is feeling better.  Less short of breath.  Occasional cough.  Denies any chest pain.  However he does mention that he has had about five episodes of loose stools since yesterday.  Denies any abdominal pain.  No nausea or vomiting.  Denies any blood in the stool.    Assessment/Plan:  Pneumonia due to COVID-19   Recent Labs  Lab 03/06/20 1754 03/07/20 0502 03/08/20 0358 03/08/20 0409  DDIMER 2.40* 1.60* 1.27*  --   FERRITIN 681*  --   --   --   CRP 15.6*  --   --  13.8*  ALT 35 43 64*  --   PROCALCITON 0.81  --  0.32  --     Objective findings: Fever: Has been afebrile in the last 24 hours Oxygen requirements: Remains on room air.  Saturating in the mid 90s.  COVID 19 Therapeutics: Antibacterials: Ceftriaxone and azithromycin day 2. Remdesivir: Day 3 Steroids: Dexamethasone 6 mg daily. Diuretics: Not on a scheduled basis Actemra/Baricitinib: None PUD Prophylaxis: Initiate Pepcid DVT Prophylaxis:  Lovenox   Patient underwent CT scan of his chest which did show multifocal pneumonia.  However he did not have any significant oxygen requirements.  He was started on remdesivir and steroids.  Due to elevated procalcitonin he was also started on ceftriaxone and azithromycin for a 5-day course.  Inflammatory  markers were noted to be elevated.  CRP has improved slightly to 13.8 today from 15.6.  D-dimer has been improving.  Procalcitonin also improved to 0.32.  Continue to mobilize.  Incentive spirometry.  Out of bed to chair.  The treatment plan and use of medications and known side effects were discussed with patient/family. Some of the medications used are based on case reports/anecdotal data.  All other medications being used in the management of COVID-19 based on limited study data.  Complete risks and long-term side effects are unknown, however in the best clinical judgment they seem to be of some benefit.  Patient/family wanted to proceed with treatment options provided.  Acute  kidney injury Came in with a BUN of 54 and creatinine of 3.15. Most likely due to hypovolemia from GI loss.  Patient has been hydrated.  Creatinine has improved to 1.83 today.  BUN is 50.  Continue IV fluids for now.  Encourage oral intake.  Recheck labs tomorrow.  Monitor urine output.   Acute diarrhea Secondary to COVID-19.  Abdomen is benign.  Imodium.  Transaminitis Secondary to COVID-19.  History of coronary artery disease Seems to be stable. Continue Plavix statin.  History of stroke Apparently has right-sided weakness although patient mentions that he is able to ambulate without any difficulty. Seems to be stable. Continue statin.  According to his daughter patient does have some cognitive impairment as a result of the stroke. PT and OT evaluation.  Essential hypertension Monitor blood pressures. Holding lisinopril.  Blood pressure is reasonably well controlled.  History of seizure disorder Continue Dilantin.  Dilantin level was low so the dose of his medication was increased from 100 mg daily to 200 mg daily.  Recheck Dilantin level on Sunday.  Hyperlipidemia Continue statin.  Obesity Estimated body mass index is 30.17 kg/m as calculated from the following:   Height as of 03/20/18: 6\' 2"  (1.88 m).   Weight as of 03/20/18: 106.6 kg.   DVT Prophylaxis: Lovenox Code Status: Full code Family Communication: Daughter will be updated today again. Disposition Plan: Hopefully return home when improved  Status is: Inpatient  Remains inpatient appropriate because:IV treatments appropriate due to intensity of illness or inability to take PO and Inpatient level of care appropriate due to severity of illness   Dispo:  Patient From: Home  Planned Disposition: Home  Expected discharge date: 03/09/20  Medically stable for discharge: No       Medications:  Scheduled: . vitamin C  500 mg Oral Daily  . atorvastatin  40 mg Oral Daily  . azithromycin  500 mg Oral Daily  .  clopidogrel  75 mg Oral Q breakfast  . dexamethasone (DECADRON) injection  6 mg Intravenous Q24H  . [START ON 03/09/2020] enoxaparin (LOVENOX) injection  40 mg Subcutaneous Q24H  . famotidine  20 mg Oral Daily  . phenytoin  200 mg Oral Daily  . sodium chloride flush  3 mL Intravenous Q12H  . zinc sulfate  220 mg Oral Daily   Continuous: . sodium chloride 75 mL/hr at 03/07/20 0518  . cefTRIAXone (ROCEPHIN)  IV Stopped (03/07/20 1400)  . remdesivir 100 mg in NS 100 mL 100 mg (03/08/20 1032)   03/10/20, guaiFENesin-dextromethorphan, HYDROcodone-acetaminophen, melatonin, ondansetron **OR** ondansetron (ZOFRAN) IV   Objective:  Vital Signs  Vitals:   03/08/20 0700 03/08/20 0815 03/08/20 1030 03/08/20 1033  BP: 108/64 131/85 128/89 131/85  Pulse: 80 (!) 103 90 (!) 103  Resp: (!) 21 20 15 20   Temp:  98.9 F (37.2 C)  98.9 F (37.2 C)  TempSrc:    Oral  SpO2: 97% 98% 98% 98%    Intake/Output Summary (Last 24 hours) at 03/08/2020 1132 Last data filed at 03/07/2020 2034 Gross per 24 hour  Intake 200 ml  Output --  Net 200 ml   There were no vitals filed for this visit.  General appearance: Awake alert.  In no distress Resp: Normal effort.  Few crackles bilateral bases.  Improved air entry compared to yesterday.  No wheezing or rhonchi. Cardio: S1-S2 is normal regular.  No S3-S4.  No rubs murmurs or bruit GI: Abdomen is soft.  Nontender nondistended.  Bowel sounds are present normal.  No masses organomegaly Extremities: No edema.  Full range of motion of lower extremities. Neurologic:  No focal neurological deficits.    Lab Results:  Data Reviewed: I have personally reviewed following labs and imaging studies  CBC: Recent Labs  Lab 03/06/20 1754 03/07/20 0502 03/08/20 0358  WBC 10.2 8.9 8.5  NEUTROABS 8.3* 6.7 6.2  HGB 13.4 12.8* 11.1*  HCT 40.3 38.8* 33.0*  MCV 90.8 93.5 91.4  PLT 233 193 211    Basic Metabolic Panel: Recent Labs  Lab 03/06/20 1754  03/07/20 0502 03/08/20 0358  NA 139 140 136  K 3.9 3.7 3.8  CL 104 106 104  CO2 20* 18* 19*  GLUCOSE 114* 126* 106*  BUN 54* 54* 50*  CREATININE 3.15* 2.55* 1.83*  CALCIUM 9.0 8.5* 8.6*  MG  --  1.8 1.9    GFR: CrCl cannot be calculated (Unknown ideal weight.).  Liver Function Tests: Recent Labs  Lab 03/06/20 1754 03/07/20 0502 03/08/20 0358  AST 39 52* 75*  ALT 35 43 64*  ALKPHOS 84 75 65  BILITOT 0.6 0.5 0.5  PROT 8.3* 7.3 6.7  ALBUMIN 4.3 3.8 3.4*    Lipid Profile: Recent Labs    03/06/20 1754  TRIG 220*    Anemia Panel: Recent Labs    03/06/20 1754  FERRITIN 681*    Recent Results (from the past 240 hour(s))  SARS Coronavirus 2 by RT PCR (hospital order, [facility]) Nasopharyngeal Nasopharyngeal Swab     Status: Abnormal   Collection Time: 03/06/20  1:24 PM   Specimen: Nasopharyngeal Swab  Result Value Ref Range Status   SARS Coronavirus 2 POSITIVE (A) NEGATIVE Final    Comment: RESULT CALLED TO, READ BACK BY AND VERIFIED WITH: A DECHAMPAIN AT 1524 ON 03/06/2020 BY MOSLEY,J (NOTE) SARS-CoV-2 target nucleic acids are DETECTED  SARS-CoV-2 RNA is generally detectable in upper respiratory specimens  during the acute phase of infection.  Positive results are indicative  of the presence of the identified virus, but do not rule out bacterial infection or co-infection with other pathogens not detected by the test.  Clinical correlation with patient history and  other diagnostic information is necessary to determine patient infection status.  The expected result is negative.  Fact Sheet for Patients:   BoilerBrush.com.cyhttps://www.fda.gov/media/136312/download   Fact Sheet for Healthcare Providers:   https://pope.com/https://www.fda.gov/media/136313/download    This test is not yet approved or cleared by the [place] FDA and  has been authorized for detection and/or diagnosis of SARS-CoV-2 by FDA under an Emergency Use Authorization (EUA).  This EUA  will remain in effect (mean ing this test can be used) for the duration of  the COVID-19 declaration under Section 564(b)(1) of the Act, 21 U.S.C. section 360-bbb-3(b)(1), unless the authorization is terminated  or revoked sooner.  Performed at [facility], [address] [place], [place] [address]   Blood Culture (routine x 2)     Status: None (Preliminary result)   Collection Time: 03/06/20  5:54 PM   Specimen: BLOOD LEFT HAND  Result Value Ref Range Status   Specimen Description   Final    BLOOD LEFT HAND Performed at [facility], [address] [place], [place] [address]    Special Requests   Final    BOTTLES DRAWN AEROBIC AND ANAEROBIC Blood Culture results may not be optimal due to an inadequate volume of blood received in culture bottles Performed at [facility], [address], [place], [place] [address]    Culture   Final    NO GROWTH 2 DAYS Performed at [facility], [address] [place], [place] [address]    Report Status PENDING  Incomplete  Blood Culture (routine x 2)     Status: None (Preliminary result)   Collection Time: 03/06/20  5:54 PM   Specimen: BLOOD  Result Value Ref Range Status   Specimen Description   Final    BLOOD RIGHT ANTECUBITAL Performed at [facility], [address] [place], [place] [address]    Special Requests   Final    BOTTLES DRAWN AEROBIC AND ANAEROBIC Blood Culture results may not be optimal due to an inadequate volume of blood received in culture bottles Performed at [facility], [address] [place], [place] [address]    Culture   Final    NO GROWTH 2 DAYS Performed at [facility], [address] [place], [place] [address]    Report Status PENDING  Incomplete      Radiology Studies: DG Chest 2 View  Result Date: 03/06/2020 CLINICAL DATA:  Feeling sick and weak since Monday. EXAM: CHEST - 2 VIEW COMPARISON:   10/03/2015 FINDINGS: The cardiac silhouette, mediastinal and hilar contours are within normal limits. There appears to be some type of atrial closure device. Somewhat rounded density in the right mid lung is not well seen on the lateral film. It is possible this could be an infiltrate or a pulmonary lesion. Chest CT with contrast is suggested for further evaluation. No pleural effusions. The bony thorax is intact. IMPRESSION: Rounded density in the right mid lung. Chest CT with contrast is suggested for further evaluation. Electronically Signed   By: Rudie Meyer M.D.   On: 03/06/2020 14:48   CT Chest Wo Contrast  Result Date: 03/06/2020 CLINICAL DATA:  Cough and fever.  COVID positive.  Weakness. EXAM: CT CHEST WITHOUT CONTRAST TECHNIQUE: Multidetector CT imaging of the chest was performed following the standard protocol without IV contrast. COMPARISON:  Radiograph earlier this day. FINDINGS: Cardiovascular: The thoracic aorta is normal in caliber. Presumed atrial septal closure device. The heart is normal in size. There is no pericardial effusion. Trace coronary artery calcifications. Mediastinum/Nodes: No enlarged mediastinal lymph nodes. No evidence of hilar adenopathy allowing for lack of IV contrast. No esophageal wall thickening. No visualized thyroid nodule. Lungs/Pleura: Multifocal bilateral geographic ground-glass opacities throughout both lungs, more prominent on the right. There is a slight superimposed consolidative component in the upper lobe. One of these opacities may have been the etiology of the rounded density on radiograph, there is no evidence of solid pulmonary nodule or mass. No pleural fluid. No pneumothorax. Upper Abdomen: Decreased hepatic density consistent with steatosis. Small cyst in the  left kidney is partially included. Musculoskeletal: Multilevel degenerative change throughout the spine. There are no acute or suspicious osseous abnormalities. IMPRESSION: 1. Multifocal bilateral  geographic ground-glass opacities throughout both lungs, more prominent on the right. There is a slight superimposed consolidative component in the upper lobe. Findings most consistent with multifocal pneumonia, pattern typical of COVID-19. 2. No suspicious pulmonary nodule or mass corresponding to that seen on radiograph. Radiographic appearance may have been related to underlying ground-glass opacity mentioned above. 3. Incidental note of hepatic steatosis in the upper abdomen. Aortic Atherosclerosis (ICD10-I70.0). Electronically Signed   By: Narda Rutherford M.D.   On: 03/06/2020 19:49       LOS: 2 days   Melonee Gerstel Rito Ehrlich  [facility] Pager on [email or web address]  03/08/2020, 11:32 AM

## 2020-03-09 ENCOUNTER — Other Ambulatory Visit: Payer: Self-pay

## 2020-03-09 LAB — D-DIMER, QUANTITATIVE: D-Dimer, Quant: 0.77 ug/mL-FEU — ABNORMAL HIGH (ref 0.00–0.50)

## 2020-03-09 LAB — COMPREHENSIVE METABOLIC PANEL
ALT: 57 U/L — ABNORMAL HIGH (ref 0–44)
AST: 57 U/L — ABNORMAL HIGH (ref 15–41)
Albumin: 3.3 g/dL — ABNORMAL LOW (ref 3.5–5.0)
Alkaline Phosphatase: 66 U/L (ref 38–126)
Anion gap: 12 (ref 5–15)
BUN: 38 mg/dL — ABNORMAL HIGH (ref 8–23)
CO2: 18 mmol/L — ABNORMAL LOW (ref 22–32)
Calcium: 8.4 mg/dL — ABNORMAL LOW (ref 8.9–10.3)
Chloride: 108 mmol/L (ref 98–111)
Creatinine, Ser: 1.42 mg/dL — ABNORMAL HIGH (ref 0.61–1.24)
GFR calc Af Amer: >60 mL/min (ref >60)
GFR calc non Af Amer: 52 mL/min — ABNORMAL LOW (ref >60)
Glucose, Bld: 94 mg/dL (ref 70–99)
Potassium: 3.5 mmol/L (ref 3.5–5.1)
Sodium: 138 mmol/L (ref 135–145)
Total Bilirubin: 0.3 mg/dL (ref 0.3–1.2)
Total Protein: 6.6 g/dL (ref 6.5–8.1)

## 2020-03-09 LAB — CBC WITH DIFFERENTIAL/PLATELET
Abs Immature Granulocytes: 0.03 10*3/uL (ref 0.00–0.07)
Basophils Absolute: 0 10*3/uL (ref 0.0–0.1)
Basophils Relative: 0 %
Eosinophils Absolute: 0.1 10*3/uL (ref 0.0–0.5)
Eosinophils Relative: 1 %
HCT: 32.2 % — ABNORMAL LOW (ref 39.0–52.0)
Hemoglobin: 10.5 g/dL — ABNORMAL LOW (ref 13.0–17.0)
Immature Granulocytes: 0 %
Lymphocytes Relative: 27 %
Lymphs Abs: 2.7 10*3/uL (ref 0.7–4.0)
MCH: 30 pg (ref 26.0–34.0)
MCHC: 32.6 g/dL (ref 30.0–36.0)
MCV: 92 fL (ref 80.0–100.0)
Monocytes Absolute: 0.6 10*3/uL (ref 0.1–1.0)
Monocytes Relative: 6 %
Neutro Abs: 6.6 10*3/uL (ref 1.7–7.7)
Neutrophils Relative %: 66 %
Platelets: 255 10*3/uL (ref 150–400)
RBC: 3.5 MIL/uL — ABNORMAL LOW (ref 4.22–5.81)
RDW: 13 % (ref 11.5–15.5)
WBC: 10 10*3/uL (ref 4.0–10.5)
nRBC: 0 % (ref 0.0–0.2)

## 2020-03-09 LAB — MAGNESIUM: Magnesium: 1.8 mg/dL (ref 1.7–2.4)

## 2020-03-09 LAB — C-REACTIVE PROTEIN: CRP: 8.5 mg/dL — ABNORMAL HIGH (ref <1.0)

## 2020-03-09 LAB — PROCALCITONIN: Procalcitonin: 0.21 ng/mL

## 2020-03-09 MED ORDER — POTASSIUM CHLORIDE CRYS ER 20 MEQ PO TBCR
40.0000 meq | EXTENDED_RELEASE_TABLET | Freq: Once | ORAL | Status: AC
  Administered 2020-03-09: 40 meq via ORAL
  Filled 2020-03-09: qty 2

## 2020-03-09 MED ORDER — DOXYCYCLINE HYCLATE 100 MG PO TABS
100.0000 mg | ORAL_TABLET | Freq: Two times a day (BID) | ORAL | Status: DC
  Administered 2020-03-09 – 2020-03-10 (×3): 100 mg via ORAL
  Filled 2020-03-09 (×3): qty 1

## 2020-03-09 NOTE — Progress Notes (Signed)
PROGRESS NOTE  Cameron Woodward ZOX:[id number] DOB: [birth year] DOA: 03/06/2020  PCP: Patient, No Pcp Per  Brief History/Interval Summary: 64 y.o. male with medical history significant for CVA with right -sided weakness, CAD, HTN, gout, history of seizures who presented with fever, fatigue, cough. Patient was noted to be tachycardic at initial presentation. He has not been vaccinated against COVID-19. COVID-19 test did come back positive. A CT chest was done which showed evidence for multifocal pneumonia. Patient was hospitalized for further management.   Reason for Visit: Pneumonia due to COVID-19.  Consultants: None  Procedures: None  Antibiotics: Anti-infectives (From admission, onward)   Start     Dose/Rate Route Frequency Ordered Stop   03/09/20 1100  doxycycline (VIBRA-TABS) tablet 100 mg        100 mg Oral Every 12 hours 03/09/20 1048 03/12/20 0959   03/08/20 1000  remdesivir 100 mg in sodium chloride 0.9 % 100 mL IVPB  Status:  Discontinued       "Followed by" Linked Group Details   100 mg 200 mL/hr over 30 Minutes Intravenous Daily 03/07/20 0404 03/07/20 0406   03/07/20 1300  cefTRIAXone (ROCEPHIN) 2 g in sodium chloride 0.9 % 100 mL IVPB  Status:  Discontinued        2 g 200 mL/hr over 30 Minutes Intravenous Every 24 hours 03/07/20 1203 03/09/20 1048   03/07/20 1300  azithromycin (ZITHROMAX) tablet 500 mg  Status:  Discontinued        500 mg Oral Daily 03/07/20 1203 03/09/20 1048   03/07/20 1000  remdesivir 100 mg in sodium chloride 0.9 % 100 mL IVPB       "Followed by" Linked Group Details   100 mg 200 mL/hr over 30 Minutes Intravenous Daily 03/06/20 2225 03/11/20 0959   03/07/20 0415  remdesivir 200 mg in sodium chloride 0.9% 250 mL IVPB  Status:  Discontinued       "Followed by" Linked Group Details   200 mg 580 mL/hr over 30 Minutes Intravenous Once 03/07/20 0404 03/07/20 0406   03/06/20 2230  remdesivir 200 mg in sodium chloride 0.9% 250 mL IVPB       "Followed by"  Linked Group Details   200 mg 580 mL/hr over 30 Minutes Intravenous Once 03/06/20 2225 03/07/20 0100      Subjective/Interval History: Patient states that he is feeling better.  No longer short of breath.  Able to ambulate to the bathroom.  Occasional cough.  No nausea vomiting.  Diarrhea appears to be subsiding.  Denies any abdominal pain.     Assessment/Plan:  Pneumonia due to COVID-19   Recent Labs  Lab 03/06/20 1754 03/07/20 0502 03/08/20 0358 03/08/20 0409 03/09/20 0409  DDIMER 2.40* 1.60* 1.27*  --  0.77*  FERRITIN 681*  --   --   --   --   CRP 15.6*  --   --  13.8* 8.5*  ALT 35 43 64*  --  57*  PROCALCITON 0.81  --  0.32  --  0.21    Objective findings: Fever: Remains afebrile Oxygen requirements: Room air.  Saturating in the mid to late 90s.   COVID 19 Therapeutics: Antibacterials: Initially placed on ceftriaxone and azithromycin.  Will be changed over to doxycycline today.  Today will be day 3. Remdesivir: Day 4 Steroids: Dexamethasone 6 mg daily. Diuretics: Not on a scheduled basis Actemra/Baricitinib: None PUD Prophylaxis: Pepcid DVT Prophylaxis:  Lovenox   Patient underwent CT scan of his chest which did show multifocal  pneumonia.  However he did not have any significant oxygen requirements.  He was started on remdesivir and steroids.  Due to elevated procalcitonin he was also started on ceftriaxone and azithromycin for a 5-day course.  From a respiratory standpoint patient seems to be stable to improved.  He is not requiring any oxygen currently.  Inflammatory markers have improved as well.  Procalcitonin is also improved.  He will be changed over to oral doxycycline.  Continue to mobilize, incentive spirometry, out of bed to chair.  Last dose of Remdesivir will be tomorrow after which he could be discharged.  The treatment plan and use of medications and known side effects were discussed with patient/family. Some of the medications used are based on case  reports/anecdotal data.  All other medications being used in the management of COVID-19 based on limited study data.  Complete risks and long-term side effects are unknown, however in the best clinical judgment they seem to be of some benefit.  Patient/family wanted to proceed with treatment options provided.  Acute kidney injury Came in with a BUN of 54 and creatinine of 3.15. Most likely due to hypovolemia from GI loss.  Patient has been hydrated.  Improved significantly.  He has been making urine.  Bicarbonate level is stable.  Stop IV fluids.  Replace potassium.  Encourage oral intake.    Acute diarrhea Secondary to COVID-19.  Improved with Imodium.  Transaminitis Secondary to COVID-19.  Stable.  History of coronary artery disease Seems to be stable. Continue Plavix statin.  Okay to discontinue telemetry.  Normocytic anemia Mild drop in hemoglobin is likely dilutional.  No evidence of overt bleeding.  Outpatient evaluation.  History of stroke Apparently has right-sided weakness although patient mentions that he is able to ambulate without any difficulty. Continue statin.  According to his daughter patient does have some cognitive impairment as a result of the stroke. PT and OT evaluation.  Essential hypertension Monitor blood pressures. Holding lisinopril.  Blood pressure is reasonably well controlled.  Occasional high readings noted.  History of seizure disorder Continue Dilantin.  Dilantin level was low so the dose of his medication was increased from 100 mg daily to 200 mg daily.  Recheck Dilantin level on Sunday.  Hyperlipidemia Continue statin.  Obesity Estimated body mass index is 30.17 kg/m as calculated from the following:   Height as of 03/20/18: 6\' 2"  (1.88 m).   Weight as of 03/20/18: 106.6 kg.   DVT Prophylaxis: Lovenox Code Status: Full code Family Communication: Daughter being updated daily although could not get a hold of her yesterday. Disposition Plan:  Hopefully return home when improved  Status is: Inpatient  Remains inpatient appropriate because:IV treatments appropriate due to intensity of illness or inability to take PO   Dispo:  Patient From: Home  Planned Disposition: Home  Expected discharge date: 03/10/20  Medically stable for discharge: No        Medications:  Scheduled: . vitamin C  500 mg Oral Daily  . atorvastatin  40 mg Oral Daily  . clopidogrel  75 mg Oral Q breakfast  . dexamethasone (DECADRON) injection  6 mg Intravenous Q24H  . doxycycline  100 mg Oral Q12H  . enoxaparin (LOVENOX) injection  40 mg Subcutaneous Q24H  . famotidine  20 mg Oral Daily  . phenytoin  200 mg Oral Daily  . potassium chloride  40 mEq Oral Once  . sodium chloride flush  3 mL Intravenous Q12H  . zinc sulfate  220 mg Oral  Daily   Continuous: . remdesivir 100 mg in NS 100 mL Stopped (03/08/20 1836)   CBS:WHQPRFFMBWGYK, guaiFENesin-dextromethorphan, HYDROcodone-acetaminophen, melatonin, ondansetron **OR** ondansetron (ZOFRAN) IV   Objective:  Vital Signs  Vitals:   03/08/20 2044 03/09/20 0045 03/09/20 0526 03/09/20 0847  BP: (!) 145/92 134/82 (!) 142/76 (!) 143/80  Pulse: 81 82  86  Resp: 15 15 20 19   Temp: 98.3 F (36.8 C) 98.6 F (37 C) 99.3 F (37.4 C) 98.4 F (36.9 C)  TempSrc:  Oral Oral Oral  SpO2: 100% 100% 100% 97%    Intake/Output Summary (Last 24 hours) at 03/09/2020 1048 Last data filed at 03/09/2020 1038 Gross per 24 hour  Intake 2006.68 ml  Output 950 ml  Net 1056.68 ml   There were no vitals filed for this visit.   General appearance: Awake alert.  In no distress Resp: Improved effort.  Few crackles at the bases but no wheezing or rhonchi. Cardio: S1-S2 is normal regular.  No S3-S4.  No rubs murmurs or bruit GI: Abdomen is soft.  Nontender nondistended.  Bowel sounds are present normal.  No masses organomegaly Extremities: No edema.  Full range of motion of lower extremities. Neurologic:   No  focal neurological deficits.     Lab Results:  Data Reviewed: I have personally reviewed following labs and imaging studies  CBC: Recent Labs  Lab 03/06/20 1754 03/07/20 0502 03/08/20 0358 03/09/20 0409  WBC 10.2 8.9 8.5 10.0  NEUTROABS 8.3* 6.7 6.2 6.6  HGB 13.4 12.8* 11.1* 10.5*  HCT 40.3 38.8* 33.0* 32.2*  MCV 90.8 93.5 91.4 92.0  PLT 233 193 211 255    Basic Metabolic Panel: Recent Labs  Lab 03/06/20 1754 03/07/20 0502 03/08/20 0358 03/09/20 0409  NA 139 140 136 138  K 3.9 3.7 3.8 3.5  CL 104 106 104 108  CO2 20* 18* 19* 18*  GLUCOSE 114* 126* 106* 94  BUN 54* 54* 50* 38*  CREATININE 3.15* 2.55* 1.83* 1.42*  CALCIUM 9.0 8.5* 8.6* 8.4*  MG  --  1.8 1.9 1.8    GFR: CrCl cannot be calculated (Unknown ideal weight.).  Liver Function Tests: Recent Labs  Lab 03/06/20 1754 03/07/20 0502 03/08/20 0358 03/09/20 0409  AST 39 52* 75* 57*  ALT 35 43 64* 57*  ALKPHOS 84 75 65 66  BILITOT 0.6 0.5 0.5 0.3  PROT 8.3* 7.3 6.7 6.6  ALBUMIN 4.3 3.8 3.4* 3.3*    Lipid Profile: Recent Labs    03/06/20 1754  TRIG 220*    Anemia Panel: Recent Labs    03/06/20 1754  FERRITIN 681*    Recent Results (from the past 240 hour(s))  SARS Coronavirus 2 by RT PCR (hospital order, performed in [facility] hospital lab) Nasopharyngeal Nasopharyngeal Swab     Status: Abnormal   Collection Time: 03/06/20  1:24 PM   Specimen: Nasopharyngeal Swab  Result Value Ref Range Status   SARS Coronavirus 2 POSITIVE (A) NEGATIVE Final    Comment: RESULT CALLED TO, READ BACK BY AND VERIFIED WITH: A DECHAMPAIN AT 1524 ON 03/06/2020 BY MOSLEY,J (NOTE) SARS-CoV-2 target nucleic acids are DETECTED  SARS-CoV-2 RNA is generally detectable in upper respiratory specimens  during the acute phase of infection.  Positive results are indicative  of the presence of the identified virus, but do not rule out bacterial infection or co-infection with other pathogens not detected by the test.   Clinical correlation with patient history and  other diagnostic information is necessary to determine  patient infection status.  The expected result is negative.  Fact Sheet for Patients:   BoilerBrush.com.cy   Fact Sheet for Healthcare Providers:   https://pope.com/    This test is not yet approved or cleared by the [place] FDA and  has been authorized for detection and/or diagnosis of SARS-CoV-2 by FDA under an Emergency Use Authorization (EUA).  This EUA will remain in effect (mean ing this test can be used) for the duration of  the COVID-19 declaration under Section 564(b)(1) of the Act, 21 U.S.C. section 360-bbb-3(b)(1), unless the authorization is terminated or revoked sooner.  Performed at [facility], [address] [place], [place] [address]   Blood Culture (routine x 2)     Status: None (Preliminary result)   Collection Time: 03/06/20  5:54 PM   Specimen: BLOOD LEFT HAND  Result Value Ref Range Status   Specimen Description   Final    BLOOD LEFT HAND Performed at [facility], [address], [place], [place] [address]    Special Requests   Final    BOTTLES DRAWN AEROBIC AND ANAEROBIC Blood Culture results may not be optimal due to an inadequate volume of blood received in culture bottles Performed at [facility], [address] [place], [place] [address]    Culture   Final    NO GROWTH 2 DAYS Performed at [facility], [address] [place], [place] [address]    Report Status PENDING  Incomplete  Blood Culture (routine x 2)     Status: None (Preliminary result)   Collection Time: 03/06/20  5:54 PM   Specimen: BLOOD  Result Value Ref Range Status   Specimen Description   Final    BLOOD RIGHT ANTECUBITAL Performed at [facility], [address], [place], [place] [address]    Special Requests   Final    BOTTLES DRAWN  AEROBIC AND ANAEROBIC Blood Culture results may not be optimal due to an inadequate volume of blood received in culture bottles Performed at [facility], [address] [place], [place] [address]    Culture   Final    NO GROWTH 2 DAYS Performed at [facility], [address] [place], [place] [address]    Report Status PENDING  Incomplete      Radiology Studies: No results found.     LOS: 3 days   Kimberly Coye [employer] on [email or web address]  03/09/2020, 10:48 AM

## 2020-03-09 NOTE — Progress Notes (Signed)
Occupational Therapy Evaluation Patient Details Name: Cameron Woodward MRN: [id number] DOB: 07/26/1955 Today's Date: 03/09/2020    History of Present Illness Cameron Woodward is a 64 y.o. male with medical history significant for CVA with right -sided weakness, CAD, HTN, gout, history of seizures who presents to [facility] with fever, fatigue, cough and tachycardic.   Clinical Impression   PTA pt PLOF living at home with daughter I with ADLs and IADLs, in one level home with no AE. Pt currently I to Mod I with ADLs and functional transfers with no AE and no sign of SOB. Demonstrates good activity tolerance with no decreased in O2 sats on RA. No further OT needed after DC and in acute stay. OT signing off. Thank you for the referral.     Follow Up Recommendations  No OT follow up    Equipment Recommendations  None recommended by OT    Recommendations for Other Services       Precautions / Restrictions Precautions Precautions: None Restrictions Weight Bearing Restrictions: No      Mobility Bed Mobility Overal bed mobility: Needs Assistance Bed Mobility: Supine to Sit     Supine to sit: Modified independent (Device/Increase time);HOB elevated     General bed mobility comments: increased time mostly, no assistance needed to elevate trunk or present BLE to EOB.   Transfers Overall transfer level: Needs assistance   Transfers: Sit to/from Stand Sit to Stand: Independent         General transfer comment: assessed with supervision initially for safety, no LOB or instability observed with simulated toilet transfer    Balance Overall balance assessment: Needs assistance Sitting-balance support: No upper extremity supported;Feet supported Sitting balance-Leahy Scale: Good     Standing balance support: No upper extremity supported;During functional activity Standing balance-Leahy Scale: Good                             ADL either performed or assessed with clinical  judgement   ADL Overall ADL's : Modified independent                                       General ADL Comments: increased time for safety O2 sats, no decrease in levels when performing functional tasks and no assistance needed.     Vision         Perception     Praxis      Pertinent Vitals/Pain       Hand Dominance     Extremity/Trunk Assessment Upper Extremity Assessment Upper Extremity Assessment: Overall WFL for tasks assessed   Lower Extremity Assessment Lower Extremity Assessment: Defer to PT evaluation   Cervical / Trunk Assessment Cervical / Trunk Assessment: Normal   Communication Communication Communication: No difficulties   Cognition Arousal/Alertness: Suspect due to medications (initially, once oriented more alert throughout session. ) Behavior During Therapy: WFL for tasks assessed/performed Overall Cognitive Status: Within Functional Limits for tasks assessed                                 General Comments: Pt appears to be confused initially with some tangential speech.    General Comments  on RA O2 sats rangedf from 95-98%    Exercises     Shoulder Instructions      Home Living  Family/patient expects to be discharged to:: Private residence Living Arrangements: Children (66 year old daughter) Available Help at Discharge: Family;Available PRN/intermittently Type of Home: House Home Access: Stairs to enter [employer] of Steps: 4-5 Entrance Stairs-Rails: Left Home Layout: One level     Bathroom Shower/Tub: Chief Strategy Officer: Standard     Home Equipment: None          Prior Functioning/Environment Level of Independence: Independent                 OT Problem List:        OT Treatment/Interventions:      OT Goals(Current goals can be found in the care plan section)    OT Frequency:     Barriers to D/C:            Co-evaluation PT/OT/SLP  Co-Evaluation/Treatment: Yes Reason for Co-Treatment: Complexity of the patient's impairments (multi-system involvement);For patient/therapist safety   OT goals addressed during session: ADL's and self-care;Strengthening/ROM      AM-PAC OT "6 Clicks" Daily Activity     Outcome Measure Help from another person eating meals?: None Help from another person taking care of personal grooming?: None Help from another person toileting, which includes using toliet, bedpan, or urinal?: None Help from another person bathing (including washing, rinsing, drying)?: None Help from another person to put on and taking off regular upper body clothing?: None Help from another person to put on and taking off regular lower body clothing?: None 6 Click Score: 24   End of Session Nurse Communication: Mobility status  Activity Tolerance: Patient tolerated treatment well Patient left: in chair;with call bell/phone within reach                   Time: 1239-1303 OT Time Calculation (min): 24 min Charges:  OT General Charges $OT Visit: 1 Visit OT Evaluation $OT Eval Low Complexity: 1 Low  [place] Old, MSOT, OTR/L  [facility] Rehabilitation Services  [phone]   Zigmund Daniel 03/09/2020, 2:39 PM

## 2020-03-09 NOTE — Evaluation (Signed)
Physical Therapy Evaluation Patient Details Name: Taahir Grisby MRN: [id number] DOB: [birth year] Today's Date: 03/09/2020   History of Present Illness  Mr. Urieta is a 64 y.o. male with medical history significant for CVA with right -sided weakness, CAD, HTN, gout, history of seizures who presents to [facility] with fever, fatigue, cough and tachycardic.  Clinical Impression  Physical therapy evaluation completed, patient is at baseline and no further PT services recommended at this time. Pt able to complete 5x STS in 10 seconds, no unsteadiness noted with transfers, ambulates around room and no LOB, on RA with SpO2 95-98%. Patient discharged to care of nursing for ambulation daily as tolerated for length of stay.     Follow Up Recommendations No PT follow up    Equipment Recommendations  None recommended by PT    Recommendations for Other Services       Precautions / Restrictions Precautions Precautions: None Restrictions Weight Bearing Restrictions: No      Mobility  Bed Mobility Overal bed mobility: Needs Assistance Bed Mobility: Supine to Sit  Supine to sit: Modified independent (Device/Increase time);HOB elevated    General bed mobility comments: increased time, no assistance needed to elevate trunk or present BLE to EOB  Transfers Overall transfer level: Needs assistance Equipment used: None Transfers: Sit to/from Stand Sit to Stand: Independent  General transfer comment: able to rise from EOB without BUE assisting; complete 5x STS in 10 seconds with good steadiness  Ambulation/Gait Ambulation/Gait assistance: Independent Gait Distance (Feet): 40 Feet Assistive device: None Gait Pattern/deviations: WFL(Within Functional Limits) Gait velocity: WFL   General Gait Details: pt ambualtes around room without AD, good bil foot clearance, able to continue conversation without SOB noted, no unsteadiness or near falls  Stairs            Wheelchair Mobility     Modified Rankin (Stroke Patients Only)       Balance Overall balance assessment: Needs assistance Sitting-balance support: No upper extremity supported;Feet supported Sitting balance-Leahy Scale: Good Sitting balance - Comments: seated EOB   Standing balance support: No upper extremity supported;During functional activity Standing balance-Leahy Scale: Good        Pertinent Vitals/Pain      Home Living Family/patient expects to be discharged to:: Private residence Living Arrangements: Children (54 year old daughter) Available Help at Discharge: Family;Available PRN/intermittently Type of Home: House Home Access: Stairs to enter Entrance Stairs-Rails: Left Entrance Stairs-Number of Steps: 4-5 Home Layout: One level Home Equipment: None      Prior Function Level of Independence: Independent    Comments: Pt reports independent with community ambulation and ADLs. Pt reports daughter is there and sometimes cooks and cleans, but he can do it independently.     Hand Dominance        Extremity/Trunk Assessment   Upper Extremity Assessment Upper Extremity Assessment: Defer to OT evaluation    Lower Extremity Assessment Lower Extremity Assessment: Overall WFL for tasks assessed (symmetrical, AROM WNL, strength 4+/5, denies numbness/tingling)    Cervical / Trunk Assessment Cervical / Trunk Assessment: Normal  Communication   Communication: No difficulties  Cognition Arousal/Alertness: Suspect due to medications (initially, once oriented more alert throughout session. ) Behavior During Therapy: WFL for tasks assessed/performed Overall Cognitive Status: Within Functional Limits for tasks assessed    General Comments: Pt appears to be confused initially with some tangential speech.       General Comments General comments (skin integrity, edema, etc.): pt on RA with SpO2 95-98% with mobility  Exercises     Assessment/Plan    PT Assessment Patent does not need  any further PT services  PT Problem List         PT Treatment Interventions      PT Goals (Current goals can be found in the Care Plan section)  Acute Rehab PT Goals Patient Stated Goal: return home PT Goal Formulation: With patient Time For Goal Achievement: 03/16/20 Potential to Achieve Goals: Good    Frequency     Barriers to discharge        Co-evaluation PT/OT/SLP Co-Evaluation/Treatment: Yes Reason for Co-Treatment: Complexity of the patient's impairments (multi-system involvement) PT goals addressed during session: Mobility/safety with mobility;Balance;Proper use of DME OT goals addressed during session: ADL's and self-care;Strengthening/ROM       AM-PAC PT "6 Clicks" Mobility  Outcome Measure Help needed turning from your back to your side while in a flat bed without using bedrails?: None Help needed moving from lying on your back to sitting on the side of a flat bed without using bedrails?: None Help needed moving to and from a bed to a chair (including a wheelchair)?: None Help needed standing up from a chair using your arms (e.g., wheelchair or bedside chair)?: None Help needed to walk in hospital room?: None Help needed climbing 3-5 steps with a railing? : None 6 Click Score: 24    End of Session   Activity Tolerance: Patient tolerated treatment well Patient left: in chair;with call bell/phone within reach Nurse Communication: Mobility status PT Visit Diagnosis: Other abnormalities of gait and mobility (R26.89)    Time: 2703-5009 PT Time Calculation (min) (ACUTE ONLY): 24 min   Charges:   PT Evaluation $PT Eval Low Complexity: 1 Low           Tori Graydon Fofana PT, DPT 03/09/20, 2:50 PM

## 2020-03-09 NOTE — Plan of Care (Signed)
  Problem: Clinical Measurements: Goal: Ability to maintain clinical measurements within normal limits will improve Outcome: Progressing Goal: Respiratory complications will improve Outcome: Progressing   

## 2020-03-09 NOTE — Plan of Care (Signed)
  Problem: Clinical Measurements: Goal: Ability to maintain clinical measurements within normal limits will improve Outcome: Progressing   

## 2020-03-10 LAB — CBC WITH DIFFERENTIAL/PLATELET
Abs Immature Granulocytes: 0.09 10*3/uL — ABNORMAL HIGH (ref 0.00–0.07)
Basophils Absolute: 0 10*3/uL (ref 0.0–0.1)
Basophils Relative: 0 %
Eosinophils Absolute: 0.1 10*3/uL (ref 0.0–0.5)
Eosinophils Relative: 1 %
HCT: 31 % — ABNORMAL LOW (ref 39.0–52.0)
Hemoglobin: 10.4 g/dL — ABNORMAL LOW (ref 13.0–17.0)
Immature Granulocytes: 1 %
Lymphocytes Relative: 31 %
Lymphs Abs: 2.7 10*3/uL (ref 0.7–4.0)
MCH: 30.3 pg (ref 26.0–34.0)
MCHC: 33.5 g/dL (ref 30.0–36.0)
MCV: 90.4 fL (ref 80.0–100.0)
Monocytes Absolute: 0.7 10*3/uL (ref 0.1–1.0)
Monocytes Relative: 8 %
Neutro Abs: 5 10*3/uL (ref 1.7–7.7)
Neutrophils Relative %: 59 %
Platelets: 264 10*3/uL (ref 150–400)
RBC: 3.43 MIL/uL — ABNORMAL LOW (ref 4.22–5.81)
RDW: 12.8 % (ref 11.5–15.5)
WBC: 8.5 10*3/uL (ref 4.0–10.5)
nRBC: 0 % (ref 0.0–0.2)

## 2020-03-10 LAB — COMPREHENSIVE METABOLIC PANEL
ALT: 57 U/L — ABNORMAL HIGH (ref 0–44)
AST: 49 U/L — ABNORMAL HIGH (ref 15–41)
Albumin: 3.2 g/dL — ABNORMAL LOW (ref 3.5–5.0)
Alkaline Phosphatase: 67 U/L (ref 38–126)
Anion gap: 10 (ref 5–15)
BUN: 29 mg/dL — ABNORMAL HIGH (ref 8–23)
CO2: 18 mmol/L — ABNORMAL LOW (ref 22–32)
Calcium: 8.4 mg/dL — ABNORMAL LOW (ref 8.9–10.3)
Chloride: 108 mmol/L (ref 98–111)
Creatinine, Ser: 1.36 mg/dL — ABNORMAL HIGH (ref 0.61–1.24)
GFR calc Af Amer: >60 mL/min (ref >60)
GFR calc non Af Amer: 55 mL/min — ABNORMAL LOW (ref >60)
Glucose, Bld: 103 mg/dL — ABNORMAL HIGH (ref 70–99)
Potassium: 3.8 mmol/L (ref 3.5–5.1)
Sodium: 136 mmol/L (ref 135–145)
Total Bilirubin: 0.6 mg/dL (ref 0.3–1.2)
Total Protein: 6.4 g/dL — ABNORMAL LOW (ref 6.5–8.1)

## 2020-03-10 LAB — MAGNESIUM: Magnesium: 1.7 mg/dL (ref 1.7–2.4)

## 2020-03-10 LAB — C-REACTIVE PROTEIN: CRP: 11.6 mg/dL — ABNORMAL HIGH (ref <1.0)

## 2020-03-10 LAB — PHENYTOIN LEVEL, TOTAL: Phenytoin Lvl: 3.1 ug/mL — ABNORMAL LOW (ref 10.0–20.0)

## 2020-03-10 LAB — D-DIMER, QUANTITATIVE: D-Dimer, Quant: 0.66 ug/mL-FEU — ABNORMAL HIGH (ref 0.00–0.50)

## 2020-03-10 MED ORDER — LISINOPRIL-HYDROCHLOROTHIAZIDE 20-12.5 MG PO TABS: 1.0000 | ORAL_TABLET | Freq: Two times a day (BID) | ORAL | Status: AC

## 2020-03-10 MED ORDER — FAMOTIDINE 20 MG PO TABS: 20.0000 mg | ORAL_TABLET | Freq: Every day | ORAL | 0 refills | Status: AC

## 2020-03-10 MED ORDER — ENSURE ENLIVE PO LIQD: 237.0000 mL | Freq: Two times a day (BID) | ORAL | Status: DC

## 2020-03-10 MED ORDER — DOXYCYCLINE HYCLATE 100 MG PO TABS: 100.0000 mg | ORAL_TABLET | Freq: Two times a day (BID) | ORAL | 0 refills | Status: AC

## 2020-03-10 MED ORDER — DEXAMETHASONE 2 MG PO TABS: ORAL_TABLET | ORAL | 0 refills | Status: AC

## 2020-03-10 MED ORDER — PHENYTOIN SODIUM EXTENDED 100 MG PO CAPS: 200.0000 mg | ORAL_CAPSULE | Freq: Every evening | ORAL | 1 refills | Status: AC

## 2020-03-10 NOTE — Progress Notes (Signed)
Pt alert and oriented, tolerating diet. D/C instructions given, pt will be d/cd to home.

## 2020-03-10 NOTE — Discharge Summary (Signed)
[facility]  Physician Discharge Summary   Patient ID: Cameron Woodward MRN: [id number] DOB/AGE: January 13, 1956 64 y.o.  Admit date: 03/06/2020 Discharge date: 03/10/2020  PCP: Patient, No Pcp Per  DISCHARGE DIAGNOSES:  Pneumonia due to COVID-19 Acute kidney injury, resolved Acute diarrhea, resolved History of coronary artery disease Transaminitis History of seizure disorder   RECOMMENDATIONS FOR OUTPATIENT FOLLOW UP: 1. Please note that dilantin dose was increased.     Home Health:none  Equipment/Devices:none     CODE STATUS:full code   DISCHARGE CONDITION: fair  Diet recommendation: heart healthy  INITIAL HISTORY: 64 y.o.malewith medical history significant for CVA withright-sided weakness,CAD, HTN, gout, history of seizures who presented with fever, fatigue, cough. Patient was noted to be tachycardic at initial presentation. He has not been vaccinated against COVID-19. COVID-19 test did come back positive. A CT chest was done which showed evidence for multifocal pneumonia. Patient was hospitalized for further management.   HOSPITAL COURSE:   Pneumonia due to COVID-19 Patient underwent CT scan of his chest which did show multifocal pneumonia.  However he did not have any significant oxygen requirements.  He was started on remdesivir and steroids.  Due to elevated procalcitonin he was also started on antibacterials.  From a respiratory standpoint patient seems to be stable to improved.  He is not requiring any oxygen currently.  Inflammatory markers have improved as well.  Procalcitonin is also improved.  He was changed over to oral doxycycline.    He has completed a 5 day course of remdesivir. Ok for discharge today.  Acute kidney injury Came in with a BUN of 54 and creatinine of 3.15. Most likely due to hypovolemia from GI loss.  Patient has been hydrated.  Improved significantly.  He has been making urine.  Bicarbonate level is stable.   Acute  diarrhea Secondary to COVID-19.  Improved with Imodium.  Transaminitis Secondary to COVID-19.  Stable.  History of coronary artery disease Seems to be stable. Continue Plavix statin.    Normocytic anemia Mild drop in hemoglobin is likely dilutional.  No evidence of overt bleeding.  Outpatient evaluation.  History of stroke Apparently has right-sided weakness although patient mentions that he is able to ambulate without any difficulty. Continue statin.  According to his daughter patient does have some cognitive impairment as a result of the stroke. Continue plavix.  Essential hypertension Had to be started on amlodipine, Lisinopril-HCTZ can be resumed after a week.   History of seizure disorder Continue Dilantin.  Dilantin level was low so the dose of his medication was increased from 100 mg daily to 200 mg daily. No seizure activity in the hospital.  Hyperlipidemia Continue statin.  Obesity Estimated body mass index is 30.17 kg/m as calculated from the following:   Height as of 03/20/18: 6\' 2"  (1.88 m).   Weight as of 03/20/18: 106.6 kg.  Discussed with his daughter. Ok for discharge today.   PERTINENT LABS:  The results of significant diagnostics from this hospitalization (including imaging, microbiology, ancillary and laboratory) are listed below for reference.    Microbiology: Recent Results (from the past 240 hour(s))  SARS Coronavirus 2 by RT PCR (hospital order, performed in [facility] hospital lab) Nasopharyngeal Nasopharyngeal Swab     Status: Abnormal   Collection Time: 03/06/20  1:24 PM   Specimen: Nasopharyngeal Swab  Result Value Ref Range Status   SARS Coronavirus 2 POSITIVE (A) NEGATIVE Final    Comment: RESULT CALLED TO, READ BACK BY AND VERIFIED WITH: A DECHAMPAIN AT  1524 ON 03/06/2020 BY MOSLEY,J (NOTE) SARS-CoV-2 target nucleic acids are DETECTED  SARS-CoV-2 RNA is generally detectable in upper respiratory specimens  during the acute phase of  infection.  Positive results are indicative  of the presence of the identified virus, but do not rule out bacterial infection or co-infection with other pathogens not detected by the test.  Clinical correlation with patient history and  other diagnostic information is necessary to determine patient infection status.  The expected result is negative.  Fact Sheet for Patients:   BoilerBrush.com.cyhttps://www.fda.gov/media/136312/download   Fact Sheet for Healthcare Providers:   https://pope.com/https://www.fda.gov/media/136313/download    This test is not yet approved or cleared by the [place] FDA and  has been authorized for detection and/or diagnosis of SARS-CoV-2 by FDA under an Emergency Use Authorization (EUA).  This EUA will remain in effect (mean ing this test can be used) for the duration of  the COVID-19 declaration under Section 564(b)(1) of the Act, 21 U.S.C. section 360-bbb-3(b)(1), unless the authorization is terminated or revoked sooner.  Performed at [facility], [address] [place], [place] [address]   Blood Culture (routine x 2)     Status: None (Preliminary result)   Collection Time: 03/06/20  5:54 PM   Specimen: BLOOD LEFT HAND  Result Value Ref Range Status   Specimen Description   Final    BLOOD LEFT HAND Performed at [facility], [address] [place], [place] [address]    Special Requests   Final    BOTTLES DRAWN AEROBIC AND ANAEROBIC Blood Culture results may not be optimal due to an inadequate volume of blood received in culture bottles Performed at [facility], [address] [place], [place] [address]    Culture   Final    NO GROWTH 4 DAYS Performed at [facility], [address] [place], [place] [address]    Report Status PENDING  Incomplete  Blood Culture (routine x 2)     Status: None (Preliminary result)   Collection Time: 03/06/20  5:54 PM   Specimen: BLOOD  Result Value Ref Range Status    Specimen Description   Final    BLOOD RIGHT ANTECUBITAL Performed at [facility], [address] [place], [place] [address]    Special Requests   Final    BOTTLES DRAWN AEROBIC AND ANAEROBIC Blood Culture results may not be optimal due to an inadequate volume of blood received in culture bottles Performed at [facility], [address] [place], [place] [address]    Culture   Final    NO GROWTH 4 DAYS Performed at [facility], [address] [place], [place] [address]    Report Status PENDING  Incomplete     Labs:  COVID-19 Labs  Recent Labs    03/08/20 0358 03/08/20 0409 03/09/20 0409 03/10/20 0347  DDIMER 1.27*  --  0.77* 0.66*  CRP  --  13.8* 8.5* 11.6*    Lab Results  Component Value Date   SARSCOV2NAA POSITIVE (A) 03/06/2020      Basic Metabolic Panel: Recent Labs  Lab 03/06/20 1754 03/07/20 0502 03/08/20 0358 03/09/20 0409 03/10/20 0347  NA 139 140 136 138 136  K 3.9 3.7 3.8 3.5 3.8  CL 104 106 104 108 108  CO2 20* 18* 19* 18* 18*  GLUCOSE 114* 126* 106* 94 103*  BUN 54* 54* 50* 38* 29*  CREATININE 3.15* 2.55* 1.83* 1.42* 1.36*  CALCIUM 9.0 8.5* 8.6* 8.4* 8.4*  MG  --  1.8 1.9 1.8 1.7   Liver Function Tests: Recent Labs  Lab 03/06/20 1754 03/07/20 0502 03/08/20 0358 03/09/20 0409 03/10/20 0347  AST 39 52* 75* 57* 49*  ALT 35 43 64* 57* 57*  ALKPHOS 84 75 65 66 67  BILITOT 0.6 0.5 0.5 0.3 0.6  PROT 8.3* 7.3 6.7 6.6 6.4*  ALBUMIN 4.3 3.8 3.4* 3.3* 3.2*   CBC: Recent Labs  Lab 03/06/20 1754 03/07/20 0502 03/08/20 0358 03/09/20 0409 03/10/20 0347  WBC 10.2 8.9 8.5 10.0 8.5  NEUTROABS 8.3* 6.7 6.2 6.6 5.0  HGB 13.4 12.8* 11.1* 10.5* 10.4*  HCT 40.3 38.8* 33.0* 32.2* 31.0*  MCV 90.8 93.5 91.4 92.0 90.4  PLT 233 193 211 255 264     IMAGING STUDIES DG Chest 2 View  Result Date: 03/06/2020 CLINICAL DATA:  Feeling sick and weak since Monday. EXAM: CHEST - 2 VIEW COMPARISON:   10/03/2015 FINDINGS: The cardiac silhouette, mediastinal and hilar contours are within normal limits. There appears to be some type of atrial closure device. Somewhat rounded density in the right mid lung is not well seen on the lateral film. It is possible this could be an infiltrate or a pulmonary lesion. Chest CT with contrast is suggested for further evaluation. No pleural effusions. The bony thorax is intact. IMPRESSION: Rounded density in the right mid lung. Chest CT with contrast is suggested for further evaluation. Electronically Signed   By: Rudie Meyer M.D.   On: 03/06/2020 14:48   CT Chest Wo Contrast  Result Date: 03/06/2020 CLINICAL DATA:  Cough and fever.  COVID positive.  Weakness. EXAM: CT CHEST WITHOUT CONTRAST TECHNIQUE: Multidetector CT imaging of the chest was performed following the standard protocol without IV contrast. COMPARISON:  Radiograph earlier this day. FINDINGS: Cardiovascular: The thoracic aorta is normal in caliber. Presumed atrial septal closure device. The heart is normal in size. There is no pericardial effusion. Trace coronary artery calcifications. Mediastinum/Nodes: No enlarged mediastinal lymph nodes. No evidence of hilar adenopathy allowing for lack of IV contrast. No esophageal wall thickening. No visualized thyroid nodule. Lungs/Pleura: Multifocal bilateral geographic ground-glass opacities throughout both lungs, more prominent on the right. There is a slight superimposed consolidative component in the upper lobe. One of these opacities may have been the etiology of the rounded density on radiograph, there is no evidence of solid pulmonary nodule or mass. No pleural fluid. No pneumothorax. Upper Abdomen: Decreased hepatic density consistent with steatosis. Small cyst in the left kidney is partially included. Musculoskeletal: Multilevel degenerative change throughout the spine. There are no acute or suspicious osseous abnormalities. IMPRESSION: 1. Multifocal bilateral  geographic ground-glass opacities throughout both lungs, more prominent on the right. There is a slight superimposed consolidative component in the upper lobe. Findings most consistent with multifocal pneumonia, pattern typical of COVID-19. 2. No suspicious pulmonary nodule or mass corresponding to that seen on radiograph. Radiographic appearance may have been related to underlying ground-glass opacity mentioned above. 3. Incidental note of hepatic steatosis in the upper abdomen. Aortic Atherosclerosis (ICD10-I70.0). Electronically Signed   By: Narda Rutherford M.D.   On: 03/06/2020 19:49    DISCHARGE EXAMINATION: Vitals:   03/10/20 0021 03/10/20 0534 03/10/20 0848 03/10/20 1214  BP: 122/79 134/86 116/77 114/71  Pulse: 82 83 88 87  Resp: Temp: 99.1 F (37.3 C) 99.2 F (37.3 C) 98.5 F (36.9 C) 99.2 F (37.3 C)  TempSrc: Oral Oral Oral  Oral  SpO2: 98% 100% 96% 100%  Weight:      Height:       General appearance: alert, cooperative, appears stated age and no distress Resp: improved air entry. few crackles at bases. no wheezing. Cardio: regular rate and rhythm, S1, S2 normal, no murmur, click, rub or gallop GI: soft, non-tender; bowel sounds normal; no masses,  no organomegaly  DISPOSITION: Home  Discharge Instructions    Call MD for:  difficulty breathing, headache or visual disturbances   Complete by: As directed    Call MD for:  extreme fatigue   Complete by: As directed    Call MD for:  persistant dizziness or light-headedness   Complete by: As directed    Call MD for:  persistant nausea and vomiting   Complete by: As directed    Call MD for:  severe uncontrolled pain   Complete by: As directed    Call MD for:  temperature >100.4   Complete by: As directed    Diet - low sodium heart healthy   Complete by: As directed    Discharge instructions   Complete by: As directed    COVID 19 INSTRUCTIONS  - You are felt to be stable enough to no longer require  inpatient monitoring, testing, and treatment, though you will need to follow the recommendations below: - Based on the CDC's non-test criteria for ending self-isolation: You may not return to work/leave the home until at least 20 days since symptom onset AND 24 hours without a fever (without taking tylenol, ibuprofen, etc.) AND have improvement in respiratory symptoms. - Do not take NSAID medications (including, but not limited to, ibuprofen, advil, motrin, naproxen, aleve, goody's powder, etc.) - Follow up with your doctor in the next week via telehealth or seek medical attention right away if your symptoms get WORSE.    Directions for you at home:  Wear a facemask You should wear a facemask that covers your nose and mouth when you are in the same room with other people and when you visit a healthcare provider. People who live with or visit you should also wear a facemask while they are in the same room with you.  Separate yourself from other people in your home As much as possible, you should stay in a different room from other people in your home. Also, you should use a separate bathroom, if available.  Avoid sharing household items You should not share dishes, drinking glasses, cups, eating utensils, towels, bedding, or other items with other people in your home. After using these items, you should wash them thoroughly with soap and water.  Cover your coughs and sneezes Cover your mouth and nose with a tissue when you cough or sneeze, or you can cough or sneeze into your sleeve. Throw used tissues in a lined trash can, and immediately wash your hands with soap and water for at least 20 seconds or use an alcohol-based hand rub.  Wash your [employer] your hands often and thoroughly with soap and water for at least 20 seconds. You can use an alcohol-based hand sanitizer if soap and water are not available and if your hands are not visibly dirty. Avoid touching your eyes, nose, and mouth  with unwashed hands.  Directions for those who live with, or provide care at home for you:  Limit the number of people who have contact with the patient If possible, have only one caregiver for the patient. Other household members should stay in  another home or place of residence. If this is not possible, they should stay in another room, or be separated from the patient as much as possible. Use a separate bathroom, if available. Restrict visitors who do not have an essential need to be in the home.  Ensure good ventilation Make sure that shared spaces in the home have good air flow, such as from an air conditioner or an opened window, weather permitting.  Wash your hands often Wash your hands often and thoroughly with soap and water for at least 20 seconds. You can use an alcohol based hand sanitizer if soap and water are not available and if your hands are not visibly dirty. Avoid touching your eyes, nose, and mouth with unwashed hands. Use disposable paper towels to dry your hands. If not available, use dedicated cloth towels and replace them when they become wet.  Wear a facemask and gloves Wear a disposable facemask at all times in the room and gloves when you touch or have contact with the patient's blood, body fluids, and/or secretions or excretions, such as sweat, saliva, sputum, nasal mucus, vomit, urine, or feces.  Ensure the mask fits over your nose and mouth tightly, and do not touch it during use. Throw out disposable facemasks and gloves after using them. Do not reuse. Wash your hands immediately after removing your facemask and gloves. If your personal clothing becomes contaminated, carefully remove clothing and launder. Wash your hands after handling contaminated clothing. Place all used disposable facemasks, gloves, and other waste in a lined container before disposing them with other household waste. Remove gloves and wash your hands immediately after handling these  items.  Do not share dishes, glasses, or other household items with the patient Avoid sharing household items. You should not share dishes, drinking glasses, cups, eating utensils, towels, bedding, or other items with a patient who is confirmed to have, or being evaluated for, COVID-19 infection. After the person uses these items, you should wash them thoroughly with soap and water.  Wash laundry thoroughly Immediately remove and wash clothes or bedding that have blood, body fluids, and/or secretions or excretions, such as sweat, saliva, sputum, nasal mucus, vomit, urine, or feces, on them. Wear gloves when handling laundry from the patient. Read and follow directions on labels of laundry or clothing items and detergent. In general, wash and dry with the warmest temperatures recommended on the label.  Clean all areas the individual has used often Clean all touchable surfaces, such as counters, tabletops, doorknobs, bathroom fixtures, toilets, phones, keyboards, tablets, and bedside tables, every day. Also, clean any surfaces that may have blood, body fluids, and/or secretions or excretions on them. Wear gloves when cleaning surfaces the patient has come in contact with. Use a diluted bleach solution (e.g., dilute bleach with 1 part bleach and 10 parts water) or a household disinfectant with a label that says EPA-registered for coronaviruses. To make a bleach solution at home, add 1 tablespoon of bleach to 1 quart (4 cups) of water. For a larger supply, add  cup of bleach to 1 gallon (16 cups) of water. Read labels of cleaning products and follow recommendations provided on product labels. Labels contain instructions for safe and effective use of the cleaning product including precautions you should take when applying the product, such as wearing gloves or eye protection and making sure you have good ventilation during use of the product. Remove gloves and wash hands immediately after  cleaning.  Monitor yourself for signs and  symptoms of illness Caregivers and household members are considered close contacts, should monitor their health, and will be asked to limit movement outside of the home to the extent possible. Follow the monitoring steps for close contacts listed on the symptom monitoring form.   If you have additional questions, contact your local health department or call the epidemiologist on call at [phone] (available 24/7). This guidance is subject to change. For the most up-to-date guidance from [facility], please refer to their website: [email or web address].hu   You were cared for by a hospitalist during your hospital stay. If you have any questions about your discharge medications or the care you received while you were in the hospital after you are discharged, you can call the unit and asked to speak with the hospitalist on call if the hospitalist that took care of you is not available. Once you are discharged, your primary care physician will handle any further medical issues. Please note that NO REFILLS for any discharge medications will be authorized once you are discharged, as it is imperative that you return to your primary care physician (or establish a relationship with a primary care physician if you do not have one) for your aftercare needs so that they can reassess your need for medications and monitor your lab values. If you do not have a primary care physician, you can call [phone] for a physician referral.   Increase activity slowly   Complete by: As directed         Allergies as of 03/10/2020   No Known Allergies     Medication List    TAKE these medications   allopurinol 100 MG tablet Commonly known as: ZYLOPRIM Take 200 mg by mouth daily.   amLODipine 10 MG tablet Commonly known as: NORVASC Take 10 mg by mouth daily.   aspirin 81 MG chewable tablet Chew 81 mg by mouth daily.    atorvastatin 40 MG tablet Commonly known as: LIPITOR Take 40 mg by mouth daily.   cloNIDine 0.1 MG tablet Commonly known as: CATAPRES Take 0.1 mg by mouth daily.   clopidogrel 75 MG tablet Commonly known as: PLAVIX Take 75 mg by mouth daily.   dexamethasone 2 MG tablet Commonly known as: DECADRON Take 2 tablets once daily for 3 days, then 1 tablet once daily for 3 days, then STOP.   doxycycline 100 MG tablet Commonly known as: VIBRA-TABS Take 1 tablet (100 mg total) by mouth every 12 (twelve) hours for 3 days.   famotidine 20 MG tablet Commonly known as: PEPCID Take 1 tablet (20 mg total) by mouth daily for 10 days. Start taking on: March 11, 2020   lisinopril-hydrochlorothiazide 20-12.5 MG tablet Commonly known as: ZESTORETIC Take 1 tablet by mouth 2 (two) times daily. MAY RESUME ON 03/18/2020 Start taking on: March 18, 2020 What changed:   additional instructions  These instructions start on March 18, 2020. If you are unsure what to do until then, ask your doctor or other care provider.   phenytoin 100 MG ER capsule Commonly known as: DILANTIN Take 2 capsules (200 mg total) by mouth every evening. What changed: how much to take          TOTAL DISCHARGE TIME: 35 mins  Cari Vandeberg [employer] on [email or web address]  03/10/2020, 3:15 PM

## 2020-03-10 NOTE — Discharge Instructions (Signed)
Please follow up with your PCP in 1 week. The dose of phenytoin (seizure med) has been increased and will need to be further addressed by your PCP.

## 2020-03-10 NOTE — Progress Notes (Signed)
Initial Nutrition Assessment  RD working remotely.  DOCUMENTATION CODES:   Not applicable  INTERVENTION:  Provide Ensure Enlive po BID, each supplement provides 350 kcal and 20 grams of protein.  NUTRITION DIAGNOSIS:   Increased nutrient needs related to catabolic illness (COVID-19) as evidenced by estimated needs.  GOAL:   Patient will meet greater than or equal to 90% of their needs  MONITOR:   PO intake, Supplement acceptance, Labs, Weight trends, I & O's  REASON FOR ASSESSMENT:   Malnutrition Screening Tool    ASSESSMENT:   64 year old male with PMHx of CAD, CVA with right-sided weakness, seizures, gout, HTN admitted with COVID-19, PNA, AKI.   Spoke with patient over the phone. He reports his appetite is good and unchanged from baseline. He is eating 100% of his meals. Discussed increased calorie/protein needs related to COVID-19. Patient is amenable to drinking ONS to help meet calorie/protein needs.  Patient reports his UBW is 228 lbs and that he is weight-stable. Current weight in chart is 103.4 kg (227.9 lbs).  Medications reviewed and include: vitamin C 500 mg daily, Decadron 6 mg Q24hrs IV, doxycycline, famotidine, zinc sulfate 220 mg daily, remdesivir.  Labs reviewed: CO2 18, BUN 29, Creatinine 1.36.  NUTRITION - FOCUSED PHYSICAL EXAM:  Unable to complete at this time as RD is working remotely.  Diet Order:   Diet Order            Diet Heart Room service appropriate? Yes; Fluid consistency: Thin  Diet effective now                EDUCATION NEEDS:   No education needs have been identified at this time  Skin:  Skin Assessment: Reviewed RN Assessment  Last BM:  03/09/2020  Height:   Ht Readings from Last 1 Encounters:  03/09/20 6\' 2"  (1.88 m)   Weight:   Wt Readings from Last 1 Encounters:  03/09/20 103.4 kg   BMI:  Body mass index is 29.26 kg/m.  Estimated Nutritional Needs:   Kcal:  2300-2500  Protein:  115-125 grams  Fluid:  >/=  2.2 L/day  03/11/20, MS, RD, LDN Pager number available on Amion

## 2020-03-11 LAB — CULTURE, BLOOD (ROUTINE X 2)
Culture: NO GROWTH
Culture: NO GROWTH

## 2022-04-02 IMAGING — CT CT CHEST W/O CM
2 of 3 series · 15 of 36 positions shown, 18 images · non-contrast
Comparison: Radiograph earlier this day.

CLINICAL DATA: Cough and fever.  COVID positive.  Weakness.

EXAM:
CT CHEST WITHOUT CONTRAST
TECHNIQUE: Multidetector CT imaging of the chest was performed following the
standard protocol without IV contrast.

[Series 2: thorax · axial · 0.83mm/px · z∈[+1348,+1650]mm · 12 of 179 slices shown, 15 images]
[im 14/179  mediastinal]
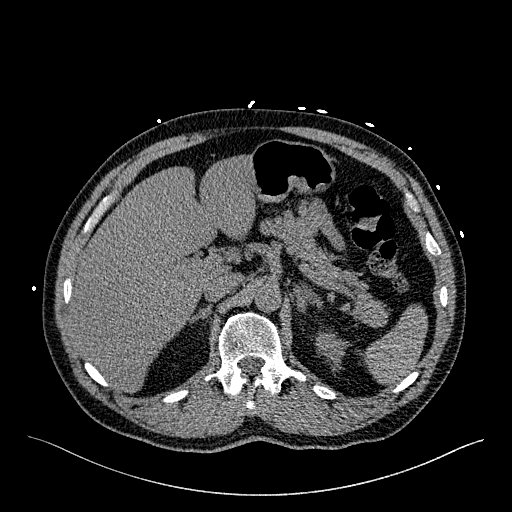
[im 14/179  lung]
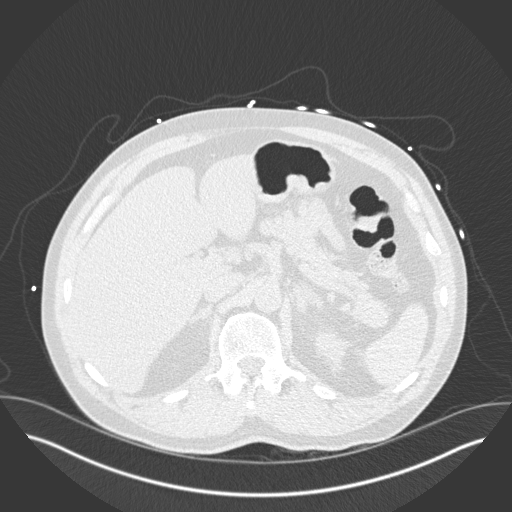
[im 27/179  lung]
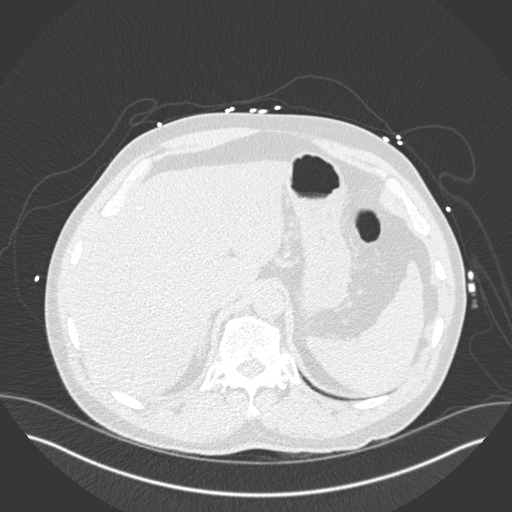
[im 40/179  lung]
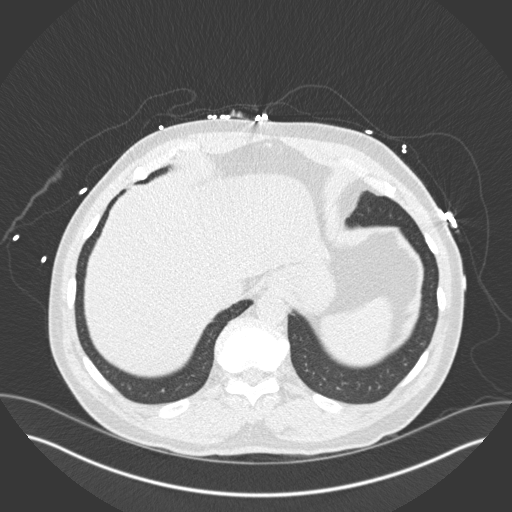
[im 53/179  lung]
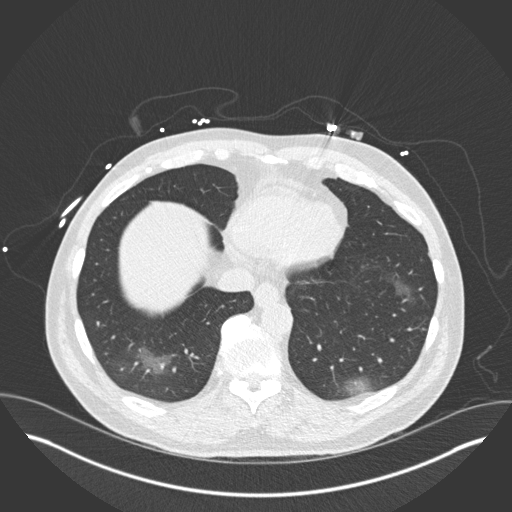
[im 66/179  mediastinal]
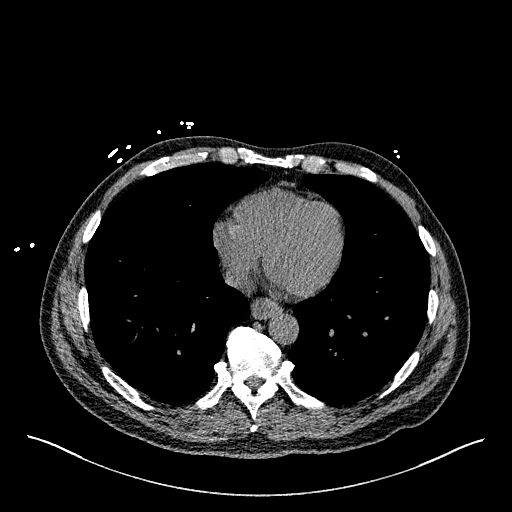
[im 66/179  lung]
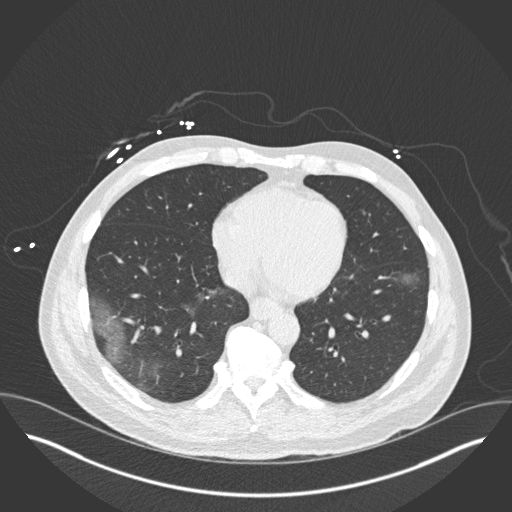
[im 80/179  lung]
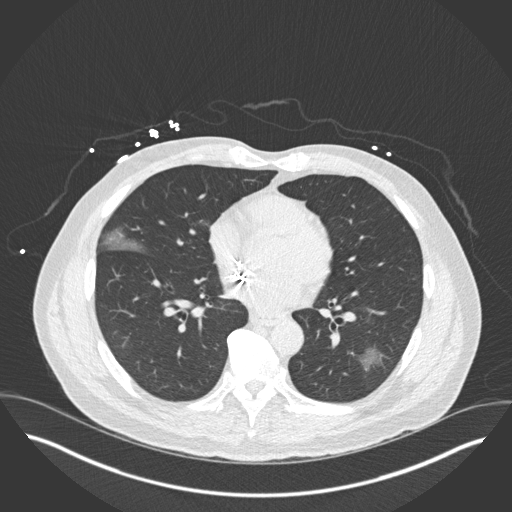
[im 99/179  lung]
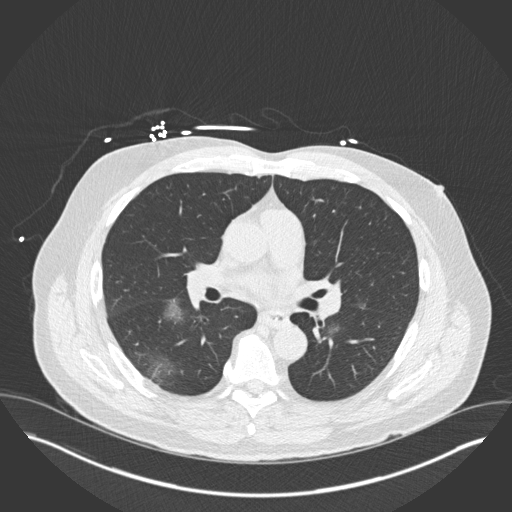
[im 113/179  lung]
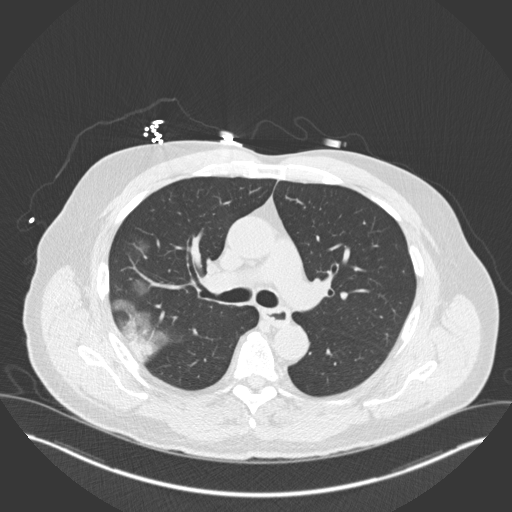
[im 126/179  mediastinal]
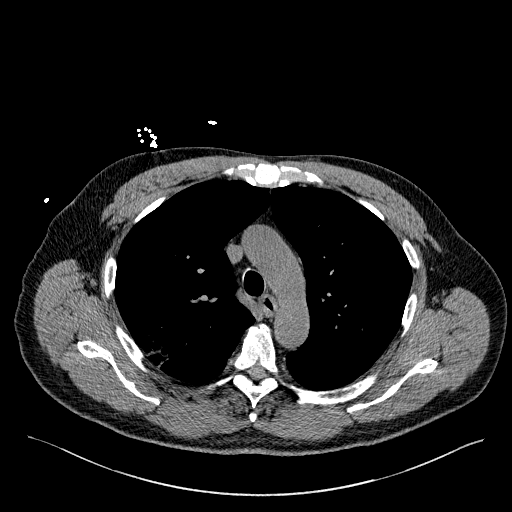
[im 126/179  lung]
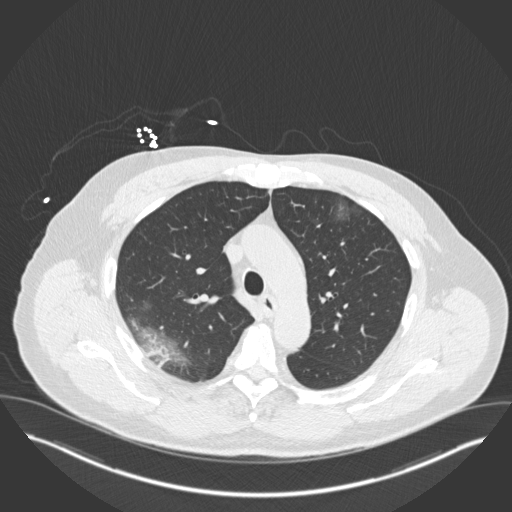
[im 139/179  lung]
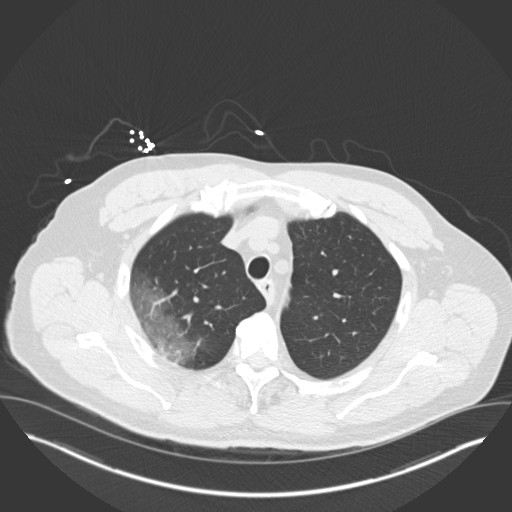
[im 152/179  lung]
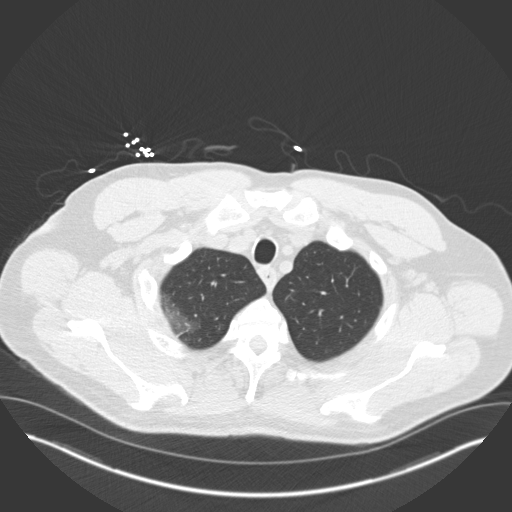
[im 165/179  lung]
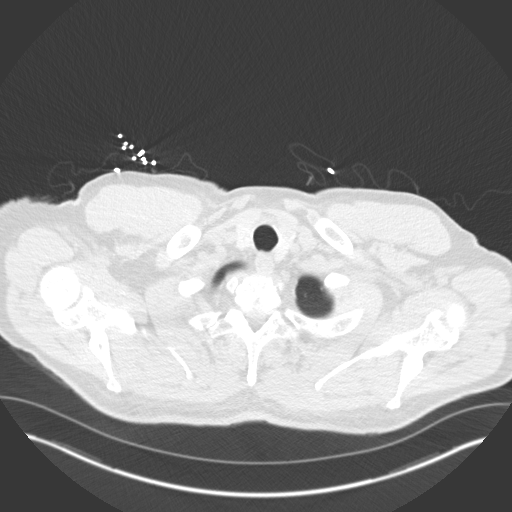

[Series 6: coronal · coronal · 0.71mm/px · 3 of 155 slices shown]
[im 31/155  lung]
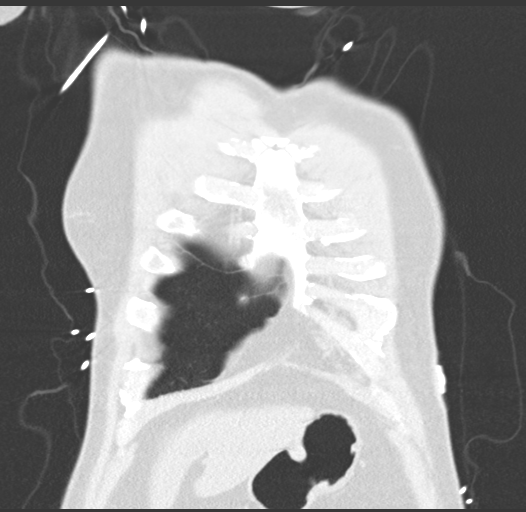
[im 62/155  lung]
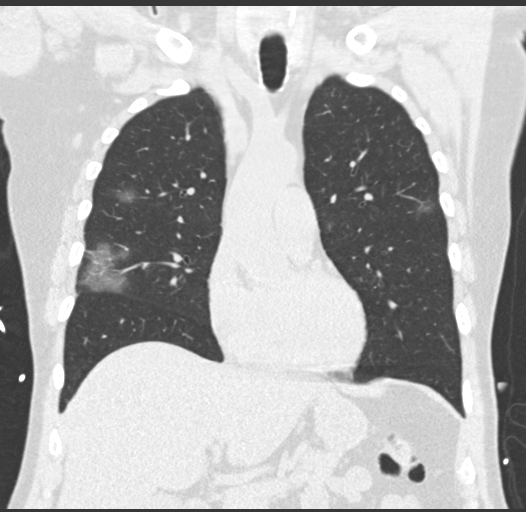
[im 93/155  lung]
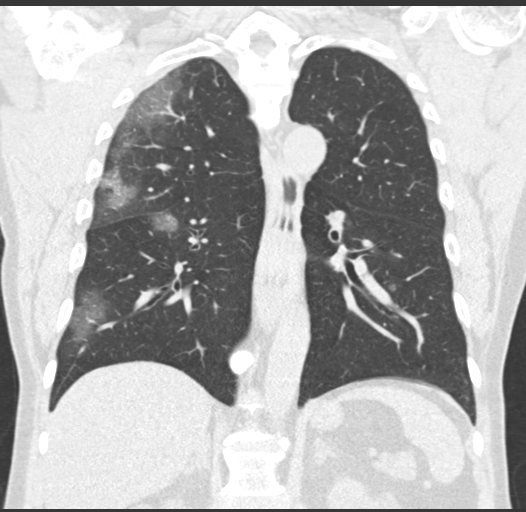

[15 of 36 positions shown; findings below may reference images not displayed]

FINDINGS: Cardiovascular: The thoracic aorta is normal in caliber. Presumed
atrial septal closure device. The heart is normal in size. There is
no pericardial effusion. Trace coronary artery calcifications.

Mediastinum/Nodes: No enlarged mediastinal lymph nodes. No evidence
of hilar adenopathy allowing for lack of IV contrast. No esophageal
wall thickening. No visualized thyroid nodule.

Lungs/Pleura: Multifocal bilateral geographic ground-glass opacities
throughout both lungs, more prominent on the right. There is a
slight superimposed consolidative component in the upper lobe. One
of these opacities may have been the etiology of the rounded density
on radiograph, there is no evidence of solid pulmonary nodule or
mass. No pleural fluid. No pneumothorax.

Upper Abdomen: Decreased hepatic density consistent with steatosis.
Small cyst in the left kidney is partially included.

Musculoskeletal: Multilevel degenerative change throughout the
spine. There are no acute or suspicious osseous abnormalities.
IMPRESSION: 1. Multifocal bilateral geographic ground-glass opacities throughout
both lungs, more prominent on the right. There is a slight
superimposed consolidative component in the upper lobe. Findings
most consistent with multifocal pneumonia, pattern typical of
[id number].
2. No suspicious pulmonary nodule or mass corresponding to that seen
on radiograph. Radiographic appearance may have been related to
underlying ground-glass opacity mentioned above.
3. Incidental note of hepatic steatosis in the upper abdomen.

Aortic Atherosclerosis ([id number]).
